# Patient Record
Sex: Female | Born: 1985
Health system: Southern US, Community
[De-identification: ages and names within clinical notes are randomized; demographics above are authoritative.]

## PROBLEM LIST (undated history)

## (undated) DIAGNOSIS — N943 Premenstrual tension syndrome: Secondary | ICD-10-CM

## (undated) DIAGNOSIS — F988 Other specified behavioral and emotional disorders with onset usually occurring in childhood and adolescence: Secondary | ICD-10-CM

## (undated) DIAGNOSIS — G932 Benign intracranial hypertension: Secondary | ICD-10-CM

## (undated) DIAGNOSIS — F329 Major depressive disorder, single episode, unspecified: Secondary | ICD-10-CM

## (undated) HISTORY — DX: Benign intracranial hypertension: G93.2

## (undated) HISTORY — DX: Other specified behavioral and emotional disorders with onset usually occurring in childhood and adolescence: F98.8

## (undated) HISTORY — PX: INNER EAR SURGERY: SHX679

## (undated) HISTORY — PX: TONSILLECTOMY: SUR1361

## (undated) HISTORY — PX: ADENOIDECTOMY: SUR15

## (undated) HISTORY — DX: Premenstrual tension syndrome: N94.3

## (undated) HISTORY — DX: Major depressive disorder, single episode, unspecified: F32.9

---

## 2008-07-27 ENCOUNTER — Emergency Department: Payer: Self-pay | Admitting: Emergency Medicine

## 2010-11-19 ENCOUNTER — Other Ambulatory Visit: Payer: Self-pay | Admitting: Neurology

## 2010-11-19 DIAGNOSIS — F32A Depression, unspecified: Secondary | ICD-10-CM

## 2010-11-19 DIAGNOSIS — F909 Attention-deficit hyperactivity disorder, unspecified type: Secondary | ICD-10-CM

## 2010-11-19 DIAGNOSIS — R519 Headache, unspecified: Secondary | ICD-10-CM

## 2010-11-19 DIAGNOSIS — F329 Major depressive disorder, single episode, unspecified: Secondary | ICD-10-CM

## 2010-11-25 ENCOUNTER — Ambulatory Visit
Admission: RE | Admit: 2010-11-25 | Discharge: 2010-11-25 | Disposition: A | Discharge status: Home / Self Care | Payer: No Typology Code available for payment source | Source: Ambulatory Visit | Attending: Neurology | Admitting: Neurology

## 2010-11-25 DIAGNOSIS — F329 Major depressive disorder, single episode, unspecified: Secondary | ICD-10-CM

## 2010-11-25 DIAGNOSIS — F909 Attention-deficit hyperactivity disorder, unspecified type: Secondary | ICD-10-CM

## 2010-11-25 DIAGNOSIS — R519 Headache, unspecified: Secondary | ICD-10-CM

## 2010-11-25 DIAGNOSIS — F32A Depression, unspecified: Secondary | ICD-10-CM

## 2011-12-03 ENCOUNTER — Encounter (INDEPENDENT_AMBULATORY_CARE_PROVIDER_SITE_OTHER): Payer: No Typology Code available for payment source | Admitting: Ophthalmology

## 2011-12-03 DIAGNOSIS — H43819 Vitreous degeneration, unspecified eye: Secondary | ICD-10-CM

## 2011-12-03 DIAGNOSIS — G932 Benign intracranial hypertension: Secondary | ICD-10-CM

## 2012-12-01 ENCOUNTER — Encounter: Payer: Self-pay | Admitting: Maternal and Fetal Medicine

## 2013-06-06 ENCOUNTER — Observation Stay: Payer: Self-pay | Admitting: Obstetrics and Gynecology

## 2013-06-06 LAB — PIH PROFILE
Anion Gap: 6 — ABNORMAL LOW (ref 7–16)
Creatinine: 0.98 mg/dL (ref 0.60–1.30)
EGFR (African American): >60
EGFR (Non-African Amer.): >60
MCV: 85 fL (ref 80–100)
RBC: 3.62 10*6/uL — ABNORMAL LOW (ref 3.80–5.20)
RDW: 13.7 % (ref 11.5–14.5)

## 2013-06-06 LAB — PROTEIN / CREATININE RATIO, URINE: Protein, Random Urine: 60 mg/dL — ABNORMAL HIGH (ref 0–12)

## 2013-06-08 ENCOUNTER — Encounter: Payer: Self-pay | Admitting: Obstetrics and Gynecology

## 2013-06-08 ENCOUNTER — Observation Stay: Payer: Self-pay

## 2013-06-08 LAB — PIH PROFILE
Anion Gap: 4 — ABNORMAL LOW (ref 7–16)
BUN: 8 mg/dL (ref 7–18)
Chloride: 106 mmol/L (ref 98–107)
EGFR (African American): >60
Glucose: 71 mg/dL (ref 65–99)
HCT: 33.1 % — ABNORMAL LOW (ref 35.0–47.0)
HGB: 11.1 g/dL — ABNORMAL LOW (ref 12.0–16.0)
MCHC: 33.5 g/dL (ref 32.0–36.0)
Osmolality: 267 (ref 275–301)
Platelet: 259 10*3/uL (ref 150–440)
Potassium: 3.9 mmol/L (ref 3.5–5.1)
Sodium: 135 mmol/L — ABNORMAL LOW (ref 136–145)
WBC: 11.7 10*3/uL — ABNORMAL HIGH (ref 3.6–11.0)

## 2013-06-09 ENCOUNTER — Inpatient Hospital Stay: Payer: Self-pay

## 2013-06-09 LAB — URINALYSIS, COMPLETE
Ketone: NEGATIVE
Leukocyte Esterase: NEGATIVE
Nitrite: NEGATIVE
Ph: 6 (ref 4.5–8.0)
RBC,UR: 1 /HPF (ref 0–5)
Squamous Epithelial: 18

## 2013-06-09 LAB — PIH PROFILE
Anion Gap: 8 (ref 7–16)
Calcium, Total: 8.7 mg/dL (ref 8.5–10.1)
Chloride: 105 mmol/L (ref 98–107)
Creatinine: 0.87 mg/dL (ref 0.60–1.30)
EGFR (African American): >60
EGFR (Non-African Amer.): >60
Platelet: 257 10*3/uL (ref 150–440)
RBC: 3.74 10*6/uL — ABNORMAL LOW (ref 3.80–5.20)
RDW: 14 % (ref 11.5–14.5)
SGOT(AST): 12 U/L — ABNORMAL LOW (ref 15–37)
Sodium: 135 mmol/L — ABNORMAL LOW (ref 136–145)
WBC: 12.4 10*3/uL — ABNORMAL HIGH (ref 3.6–11.0)

## 2013-06-09 LAB — PROTEIN / CREATININE RATIO, URINE: Protein, Random Urine: 32 mg/dL — ABNORMAL HIGH (ref 0–12)

## 2013-06-12 LAB — HEMATOCRIT: HCT: 26.7 % — ABNORMAL LOW (ref 35.0–47.0)

## 2014-04-04 ENCOUNTER — Ambulatory Visit: Payer: Self-pay | Admitting: Family Medicine

## 2014-11-30 NOTE — Consult Note (Signed)
Referral Information:  Reason for Ref[facility]1P0 [facility] 37+ [facility]  h/o Pseudotumor Cerebri, Newly recognized right unilateral pyelectasis in third trimester Elevated blood pressure in clinic today   Referring Physician [facility]   Prenatal Hx Newly recognized pyelectasis in fetus  Patient has a h/o  pseudotumor cerebri off meds doign well normal third tirmester eye exam  Recent evaluation for elevated BP on L&D , pt notes 12 pound weight gain in 2 weeks and has exchanged rings and is wearing flipflops, she vomited this am  on zoloft   Past Obstetrical Hx primigravida   Home Medications: Medication Instructions Status  Zoloft 50 mg oral tablet 1 tab(s) orally once a day Active  Prenatal 1 Prenatal Multivitamins with Folic Acid 0.975 mg oral capsule 1 cap(s) orally once a day Active   Allergies:   Amoxicillin: Hives  Zithromax: Hives  Vital Signs/Notes:  Nursing Vital Signs:  **Vital Signs.:   30-Oct-14 09:26  Vital Signs Type Routine; Perinatal Clinic  Temperature Temperature (F) 97.5  Celsius 36.3  Pulse Pulse 82  Respirations Respirations 20  Systolic BP Systolic BP 157  Diastolic BP (mmHg) Diastolic BP (mmHg) 95  Mean BP 115    09:36  Vital Signs Type Recheck  Systolic BP Systolic BP 164  Diastolic BP (mmHg) Diastolic BP (mmHg) 95  Mean BP 118    10:37  Systolic BP Systolic BP 168  Diastolic BP (mmHg) Diastolic BP (mmHg) 84  Mean BP 112   Perinatal Consult:  Past Medical History cont'd -Pseudotumor The patient reports that she had the diagnosis made 2 years ago. She was placed non acetazolamide. Since her diagnosis, the patient reports losing 30 pounds. She no longer has symptoms. For the past year, she has not been using the acetazolamide. - on zoloft  - newly elevated BP   Soc Hx married   Impression/Recommendations:  Impression IUP at 337 +w with EDC of 06/24/2013  1) History of pseudotumor cerebri off meds  2) New recognition of  left fetal pyelectasis - SFU 1- 7.284mm pelvis no caliectasis  3) elevated BP, edema  recent preeclampsia w/u , emesis this am 4) on Zoloft   Recommendations 1-[facility] BP a t 37 + weeks gestation 2-Most pediatric practitioners recommend postnatal imaging of kidney (either u/s or VCUG- voiding cysto-urethrogram  ) to assess for persistent  pyelectasis and reflux that might place the infant at risk for urinary infection. If persistent, many will offer antibiotic prophylaxis and follow up to assess for resolution of dialtion. Dr Marisue HumbleJohn Weiner and Lorayne Marekynthia Camille NP at [facility] [phone] can offer support /care as needed for the infant . 3 continue zoloft   Plan:  Delivery Mode Vaginal    Total Time Spent with Patient 15 minutes   >50% of visit spent in couseling/coordination of care yes   Office Use Only 99213  Office Visit Level 3 (15min) EST exp prob focused outpt   Coding Description: FETAL - 2nd/3rd TRIMESTER INDICATION(S).   Pylectasis.   MATERNAL CONDITIONS/HISTORY INDICATION(S).   Pre-eclampsia, mild.   OTHER: Pseudotumor Cerebri.  Electronic Signatures: Rondall AllegraLivingston, Juanangel Soderholm Gresham (MD)  (Signed 30-Oct-14 11:06)  Authored: Referral, Home Medications, Allergies, Vital Signs/Notes, Consult, Impression, Plan, Billing, Coding Description   Last Updated: 30-Oct-14 11:06 by Rondall AllegraLivingston, Anyjah Roundtree Gresham (MD)

## 2014-11-30 NOTE — Op Note (Signed)
PATIENT NAME:  Kendra Freeman, Kendra Freeman MR#:  [id number] DATE OF BIRTH:  11-23-1985  DATE OF PROCEDURE:  06/11/2013  PREOPERATIVE DIAGNOSIS: Failure to progress.   POSTOPERATIVE DIAGNOSIS: Failure to progress.   PROCEDURE: Primary low transverse c section.   SURGEON: Dr. Flint MelterLashawn A. Patton SallesWeaver- Lee   ASSISTANT: Kandice HamsAmanda Ore , scrub tech.   ANESTHESIA: Spinal.   ESTIMATED BLOOD LOSS: 400 mL.  OPERATIVE FLUIDS: 1200 mL.   COMPLICATIONS: None.   FINDINGS: Vertex female infant, 3190 grams, Apgars 9 and 9, normal uterus, tubes, and ovaries.   INDICATIONS: The patient is a 29 year old G1, P0 who presents for evaluation for gestational hypertension. She was induced secondary to that. The patient did not progress past 5 cm after many hours of adequate contractions. Decision was made to proceed to deliver via C-section. The risks, benefits, indications and alternatives of the procedure were explained and informed consent was obtained.   PROCEDURE: The patient was taken to the operating room with IV fluids running. She was prepped and draped in the usual sterile fashion in leftward tilt. A Pfannenstiel skin incision was made and carried down to underlying fascia with the knife. The fascia was nicked in the midline. The incision was extended laterally. The superior aspect of the fascia was grasped with Coker clamps and the underlying rectus muscles were dissected off. This was repeated on the inferior fascia. Rectus muscles were divided in the midline. The peritoneum was entered bluntly. The opening was extended. The bladder blade was placed. The vesicouterine peritoneum was grasped and entered sharply with the Metzenbaum. The bladder flap was created digitally. The hysterotomy incision was made and carried to underlying fetal parts. The opening was extended. The infant's head was grasped and delivered atraumatically through the hysterotomy incision. The mouth and nose were bulb suctioned. The cord was clamped x 2 and cut  and the infant was handed to the waiting nursery staff. The placenta was expressed. The uterus was exteriorized and cleared of all clot and debris. The hysterotomy incision was closed with #0 Monocryl in a running locked fashion. The surgical site was examined and found to be hemostatic. The uterus was returned to the abdomen. The abdomen and gutters were irrigated with copious amounts of warm normal saline. The peritoneum was closed with a 2-0 Vicryl. The On-Q pump apparatus was placed according to the manufacturer's instructions. The fascia was closed with #1 PDS. The skin was closed with a 4-0 Vicryl. The catheters were each bolused with 5 mL of 5% bupivacaine. The OnQ  catheters exited and the patient's abdomen were sealed using Dermabond skin glue. The catheters were secured to the patient's abdomen using Steri-Strips and Tegaderm. The patient tolerated the procedure well. Sponge, needle and instrument counts were correct x 2. The patient was taken to the recovery room.    ____________________________ Sonda PrimesLashawn A. Patton SallesWeaver-Lee, MD law:sg D: 06/11/2013 06:59:22 ET T: 06/11/2013 08:16:32 ET JOB#: [id number]  cc: Flint MelterLashawn A. Patton SallesWeaver-Lee, MD, <Dictator> Janyth ContesLASHAWN A WEAVER LEE MD ELECTRONICALLY SIGNED 06/13/2013 13:37

## 2014-11-30 NOTE — Consult Note (Signed)
Referral Information:  Reason for Referral Pseudotumor Cerebri   Referring Physician [facility]   Prenatal Hx Patient sent for consult secondary to her diagnosis of pseudotumor cerebri. The patient reports that she had the diagnosis made 2 years ago. She was placed non acetazolamide. Since her diagnosis, the patient reports losing 30 pounds. She no longer has symptoms. For the past year, she has not been using the acetazolamide.   Home Medications: Medication Instructions Status  Zoloft 50 mg oral tablet 1 tab(s) orally once a day Active  Prenatal 1 Prenatal Multivitamins with Folic Acid 0.975 mg oral capsule 1 cap(s) orally once a day Active   Allergies:   Amoxicillin: Hives  Zithromax: Hives  Vital Signs/Notes:  Nursing Vital Signs: **Vital Signs.:   24-Apr-14 09:47  Vital Signs Type Admission  Temperature Temperature (F) 98.9  Celsius 37.1  Temperature Source oral  Pulse Pulse 76  Respirations Respirations 18  Systolic BP Systolic BP 129  Diastolic BP (mmHg) Diastolic BP (mmHg) 72  Mean BP 91   Impression/Recommendations:  Impression 1) History of pseudotumor cerebri 2) Currently without symptoms and not on medications 3) Acetazolamide may be used in pregnancy if needed   Recommendations 1) Routine prenatal care 2) No special considerations needed for labor management   a) Cesarean delivery not indicated for this condition   b) Shortening or passive second stage not indicated for this condition 3) If symptoms return, refer to neurologist 4) If symptoms return, still no special considerations needed for labor management    Total Time Spent with Patient 15 minutes   >50% of visit spent in couseling/coordination of care yes   Office Use Only 99241  Level 1 (15min) NEW office consult prob focused   Coding Description: OTHER: Pseudotumor Cerebri.  Electronic Signatures: Marcelino ScotBrancazio, Shaily Librizzi (MD)  (Signed 24-Apr-14 11:12)  Authored: Referral, Home Medications,  Allergies, Vital Signs/Notes, Impression, Billing, Coding Description   Last Updated: 24-Apr-14 11:12 by Marcelino ScotBrancazio, Vonzella Althaus (MD)

## 2014-12-18 NOTE — H&P (Signed)
L&D Evaluation:  History Expanded:  HPI 29 yo G1P0 with EDC=06/24/13 by LMP=09/17/2012 and now 3213w6d for Induction of labor for elevated BP 150s systolic for a PIH. She was seen 3 days ago for elevated pressures in the office and +3 protein on a dipstick. Chemistries at that time remarkable for a mildly elevated creatinine and BUN and a protein/cr=.  She denies HA, RUQ pain, and N/V, visual changes. Was seen in DP for fetal pelviectasis on left of 7.364mm. AFI=13.7 cm today and EFW=7#4oz. [facility] Had an 8wk2d ultrasound c/w dates. Pelviectasis not seen until a 34 week ultrasound which also revealed normal growth. She is A+/VI/RI/tdap was given 9/9, flu 9/23 GBS neg, Risk factors include nullipara, first trimester testing is neg.   Presents with inc BP   Patient's Medical History obesity, pseudotumor cerebri, depression   Patient's Surgical History T&A, ear surgery   Medications Pre Natal Vitamins  Zoloft 50 mgm daily   Allergies Amoxicillin (hives) and Azithromycin (hives)   Social History none   Family History Non-Contributory   ROS:  ROS All systems were reviewed.  HEENT, CNS, GI, GU, Respiratory, CV, Renal and Musculoskeletal systems were found to be normal.   Exam:  Vital Signs 157/89   General no apparent distress   Mental Status clear   Chest clear   Heart normal sinus rhythm, no murmur/gallop/rubs   Abdomen gravid, non-tender   Estimated Fetal Weight Average for gestational age   Fetal Position vtx   Back no CVAT   Edema 2+   Reflexes 1+   Pelvic no external lesions, closed/50%/-2   Mebranes Intact   FHT normal rate with no decels   FHT Description mod variability   Ucx irregular, mild   Skin dry   Other BUN/CR=8/.87, SGOT WNL, Uric acid=4.4, plt=259K. Hmg=11.1gm/dl.   Impression:  Impression IUP at 37 5/7 weeks with gestational hypertension. Reactive NST, negative CST.   Plan:  Plan EFM/NST, IOL tonight  starting with Cervidil ripening.   Explained to patient risks of induction vs the risks of continued close observation for worsening hypertension or S&S of preeclampsia.   Comments Patient advised of the risks of induction which include hyperstimulation, fetal intolerance, failed induction, and the risks of Csection. She does wish to proceed with Induction of labor.  Pt has been fully informed of the pros and cons, risk/benefits continued close observation versus the risks of induction. She understands that there are uncommon risks to induction, which include but are not limited to:  frequent and/or prolonged uterine contractions, fetal distress, uterine rupture and lack of success of induction.  She also has been informed that if the induction is not successful a Cesarean Section may be necessary.  All questions have been answered and she is in agreement with induction.   Electronic Signatures: Letitia LibraHarris, Posey Petrik Paul (MD)  (Signed 31-Oct-14 21:10)  Authored: L&D Evaluation   Last Updated: 31-Oct-14 21:10 by Letitia LibraHarris, Domique Clapper Paul (MD)

## 2014-12-18 NOTE — H&P (Signed)
L&D Evaluation:  History:  HPI 29 yo G1P0 with EDC=06/24/13 by LMP=09/17/2012 and now 5971w5d [facility] 150s systolic for a PIH evaluation. She was seen 2 days ago for elevated pressuresin  the office and +3 protein on a dipstick. Chemistries at that time remarkable for a mildly elevated creatinine and BUN and a protein/cr=.  She denies HA, RUQ pain, and N/V, visual changes. Was seen in DP for fetal pelviectasis on left of 7.534mm. AFI=13.7 cm today and EFW=7#4oz. [facility] Had an 8wk2d ultrasound c/w dates. Pelviectasis not seen until a 34 week ultrasound which also revealed normal growth. She is A+/VI/RI/tdap was given 9/9, flu 9/23 GBS neg, Risk factors include nullipara, first trimester testing is neg.   Presents with inc BP   Patient's Medical History obesity, pseudotumor cerebri, depression   Patient's Surgical History T&A, ear surgery   Medications Pre Natal Vitamins  Zoloft 50 mgm daily   Allergies Amoxicillin (hives) and Azithromycin (hives)   Social History none   Family History Non-Contributory   ROS:  ROS All systems were reviewed.  HEENT, CNS, GI, GU, Respiratory, CV, Renal and Musculoskeletal systems were found to be normal.   Exam:  Vital Signs 157/88, 140/90, 137/81, 127/74, 128/61, 154/93, 127/69   Urine Protein trace, P/C=149mg m   General no apparent distress   Mental Status clear   Chest clear   Heart normal sinus rhythm, no murmur/gallop/rubs   Abdomen gravid, non-tender   Estimated Fetal Weight Average for gestational age   Fetal Position vtx   Edema 2+   Reflexes 1+   Pelvic no external lesions, closed/50%/-2   Mebranes Intact   FHT normal rate with no decels, 125-130 with accels to 150s to 170s   FHT Description mod variability   Ucx irregular, mild   Skin dry   Other BUN/CR=8/.87, SGOT WNL, Uric acid=4.4, plt=259K. Hmg=11.1gm/dl.   Impression:  Impression IUP at 37 5/7 weeks with gestational hypertension.  Reactive NST, negative CST.   Plan:  Plan Consulted Dr Jean RosenthalJackson and Dr Bonney AidStaebler. Plan on  IOL tonight  starting with Cervidil ripening.  Explained to patient risks of induction vs the risks of continued close observation for worsening hypertension or S&S of preeclampsia.   Comments Patient advised of the risks of induction which include hyperstimulation, fetal intolerance, failed induction, and the risks of Csection. She doeas wish to proceed with IOL. Because all labor beds are filled, will have patient call tonight to see if bed available. If no available bed, will admit tomorrow for IOL.   Electronic Signatures: Trinna BalloonGutierrez, Chardai Gangemi L (CNM)  (Signed 31-Oct-14 01:05)  Authored: L&D Evaluation   Last Updated: 31-Oct-14 01:05 by Trinna BalloonGutierrez, Kenyetta Fife L (CNM)

## 2014-12-18 NOTE — H&P (Signed)
L&D Evaluation:  History Expanded:  HPI 29 yo G1P0 at 10960w3d sent from office for elevated BP 150s systolic and 3+ protein on dip, who has hd normal BP all pregnancy, she denies HA, RUQ pain, and NV/ SHE is A+/VI/RI/tdap was given 9/9, flu 9/23 GBS neg, Risk factors include nullipara, first rimester testing is neg,   Gravida 1   Term 0   PreTerm 0   Abortion 0   Living 0   Blood Type (Maternal) A positive   Group B Strep Results Maternal (Result >5wks must be treated as unknown) negative   Maternal HIV Negative   Maternal Syphilis Ab Nonreactive   Maternal Varicella Immune   Rubella Results (Maternal) immune   Maternal T-Dap Immune   EDC 24-Jun-2013   Presents with inc BP   Patient's Medical History No Chronic Illness  obesity   Patient's Surgical History none   Medications Pre Natal Vitamins   Allergies NKDA   Social History none   Family History Non-Contributory   ROS:  ROS All systems were reviewed.  HEENT, CNS, GI, GU, Respiratory, CV, Renal and Musculoskeletal systems were found to be normal.   Exam:  Vital Signs stable  130s systolic   Urine Protein 1+   General no apparent distress   Mental Status clear   Chest clear   Heart normal sinus rhythm   Abdomen gravid, non-tender   Estimated Fetal Weight Average for gestational age   Fetal Position v   Back no CVAT   Edema no edema   Reflexes 1+   Clonus positive   Pelvic no external lesions, closed   Mebranes Intact   FHT normal rate with no decels   FHT Description strictly reactive no decels Cat 1,   Fetal Heart Rate 140   Ucx irregular   Skin dry   Lymph no lymphadenopathy   Impression:  Impression evaluation for PIH   Plan:  Plan EFM/NST, monitor contractions and for cervical change, PIH panel   Comments will check labs and do PIH eval.   Follow Up Appointment need to schedule. 3 days   Electronic Signatures: Adria DevonKlett, Kendra Freeman (MD)  (Signed 28-Oct-14  16:34)  Authored: L&D Evaluation   Last Updated: 28-Oct-14 16:34 by Adria DevonKlett, Kendra Freeman (MD)

## 2015-09-12 IMAGING — US US OB DETAIL+14 WK - NRPT [facility]
1 series · 14 of 28 positions shown · non-contrast
Comparison: none

[Series 1: us ob detail+14 wk - nrpt [facility] · 0.30mm/px · 14 of 79 slices shown]
[im 3/79]
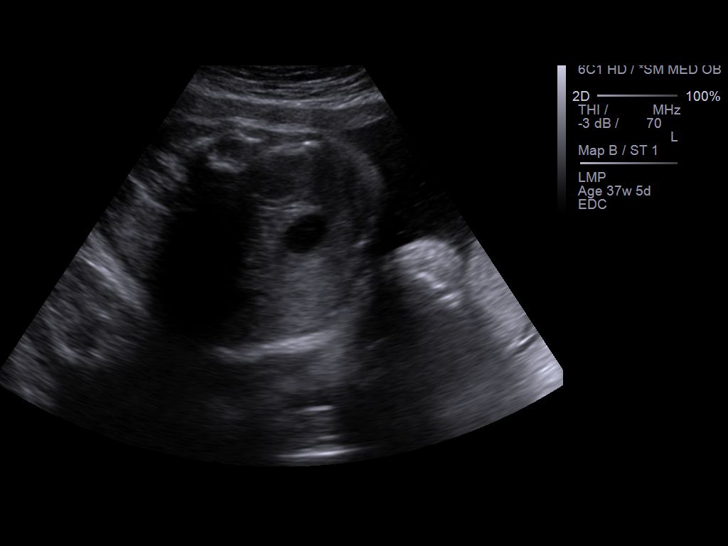
[im 9/79]
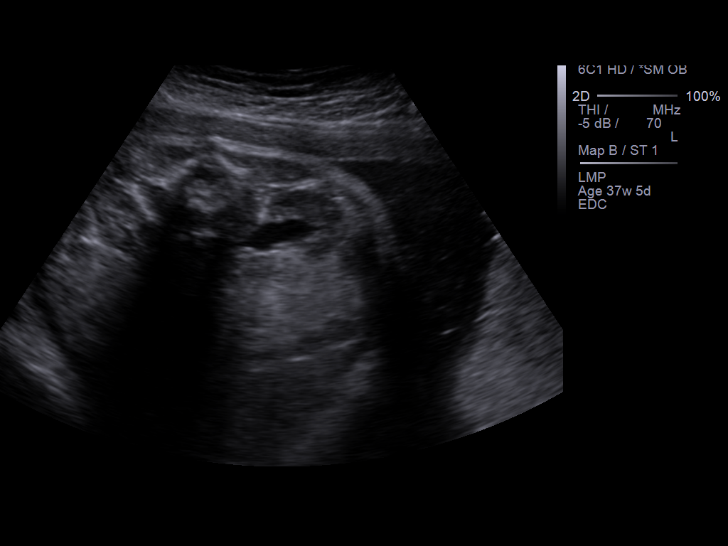
[im 15/79]
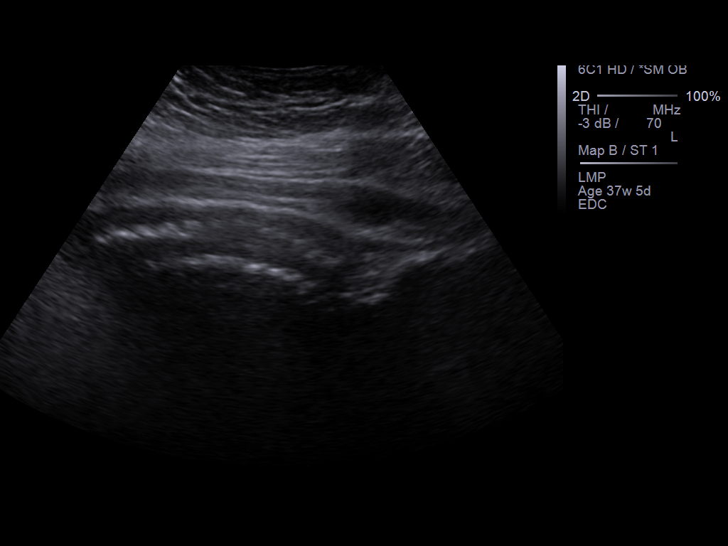
[im 21/79]
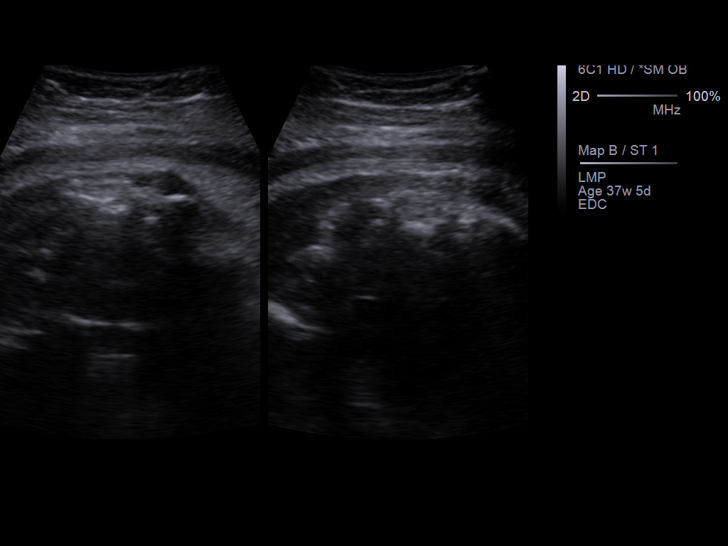
[im 27/79]
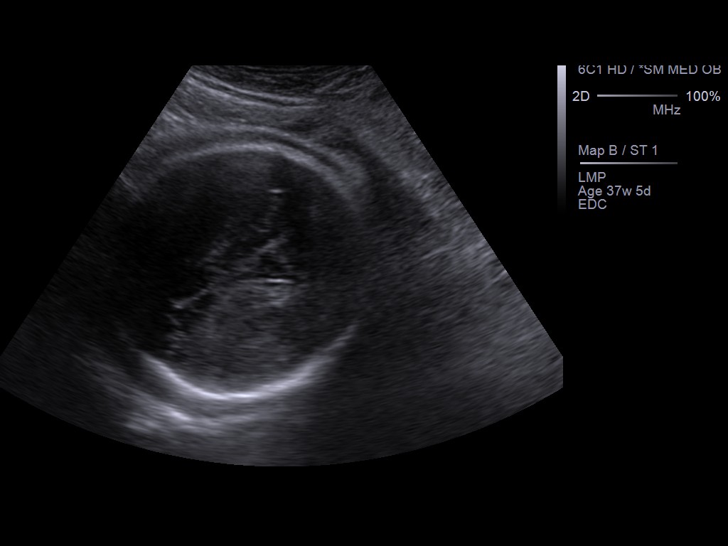
[im 32/79]
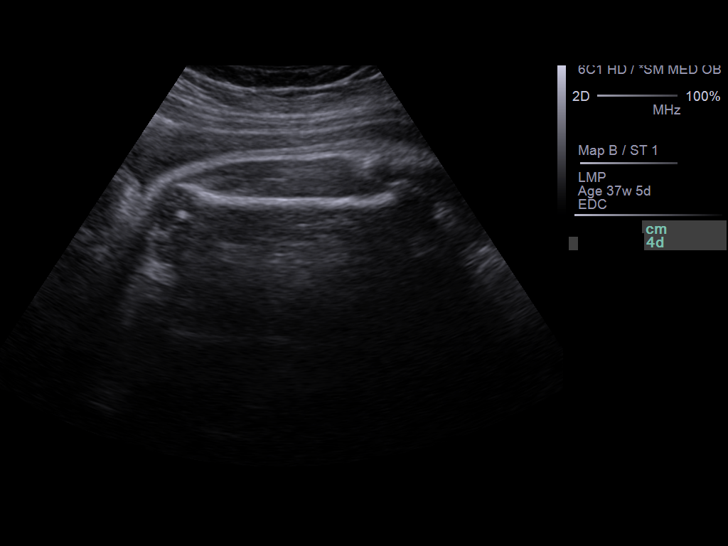
[im 38/79]
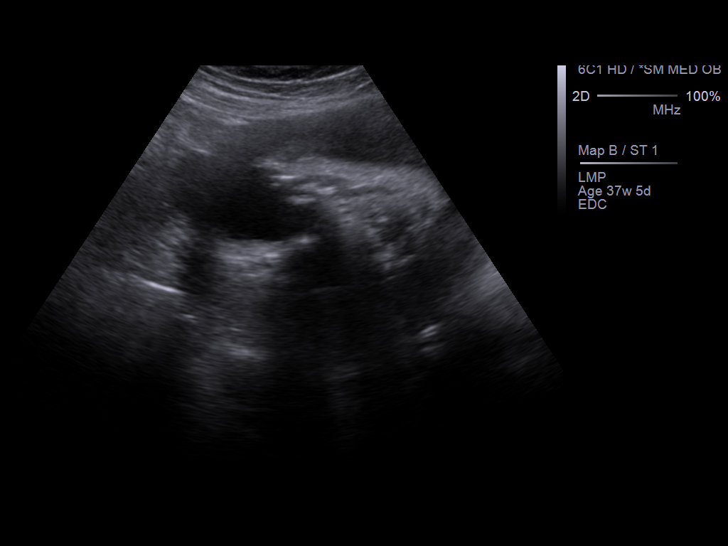
[im 44/79]
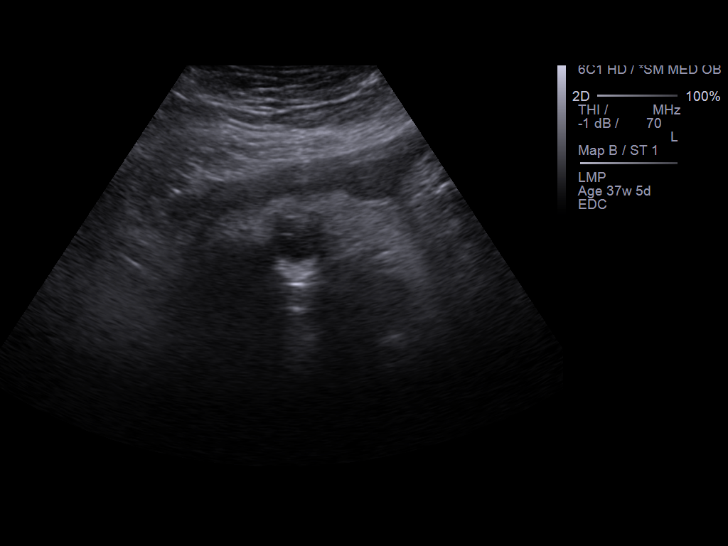
[im 50/79]
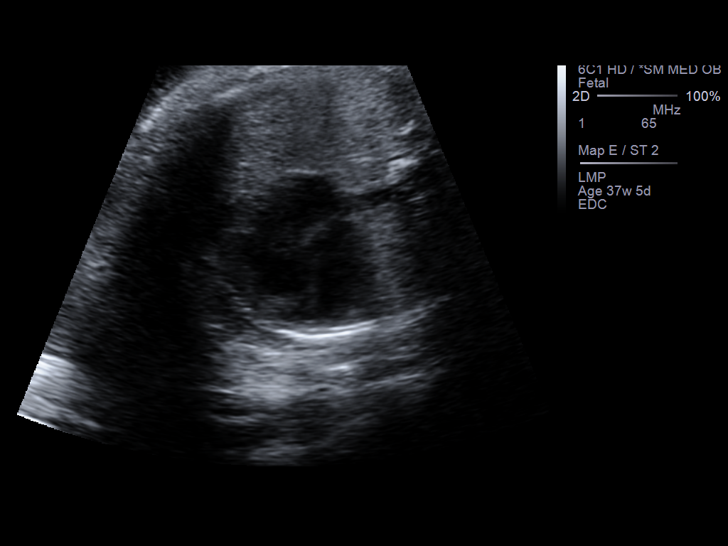
[im 55/79]
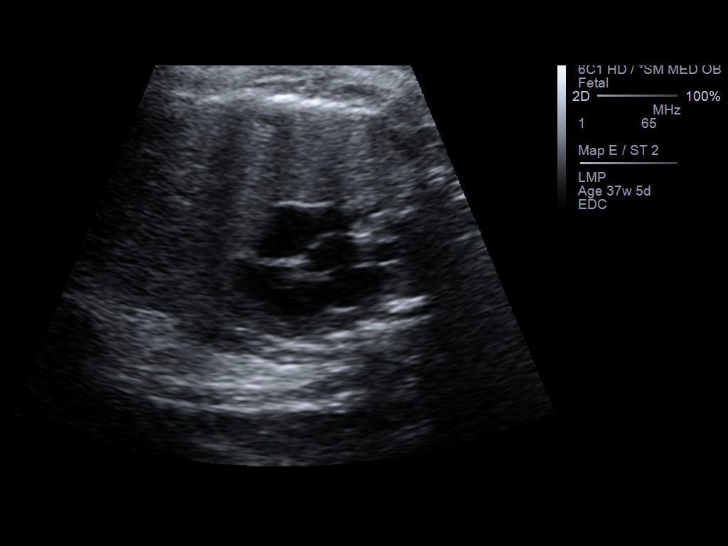
[im 61/79]
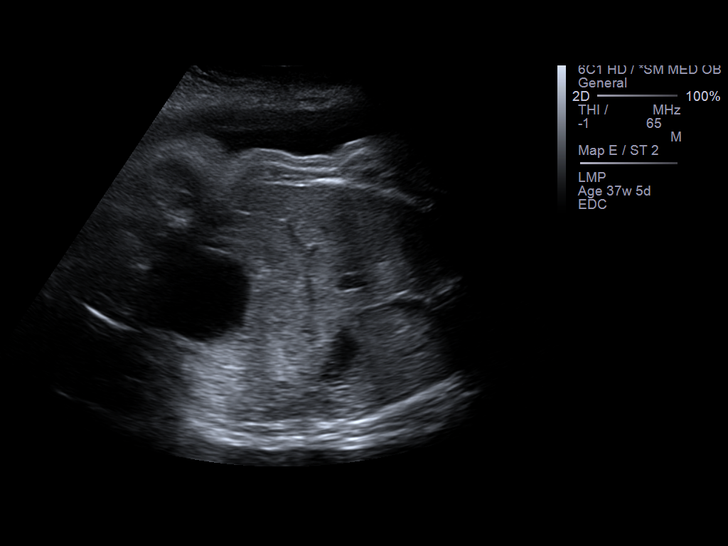
[im 67/79]
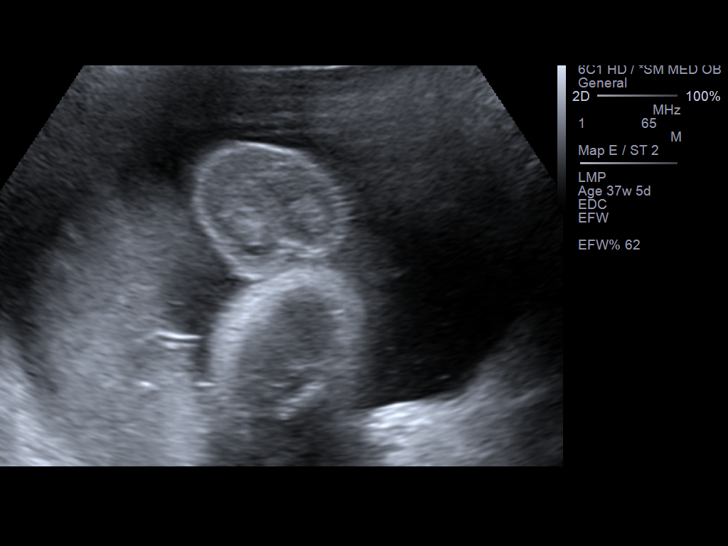
[im 73/79]
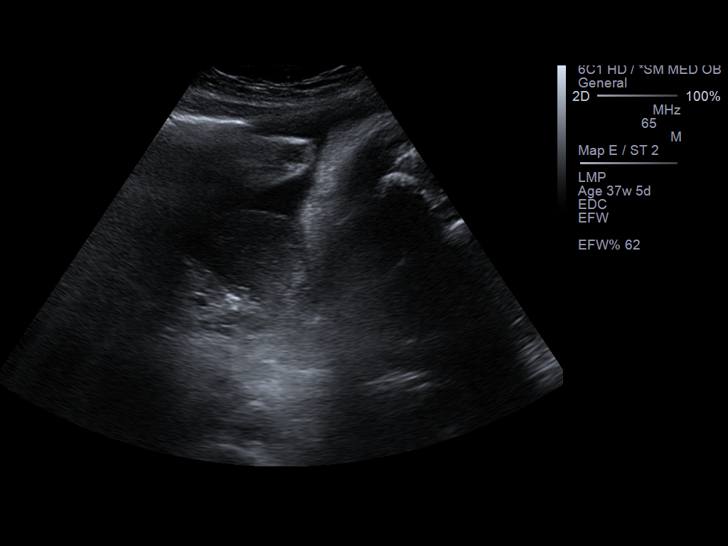
[im 79/79]
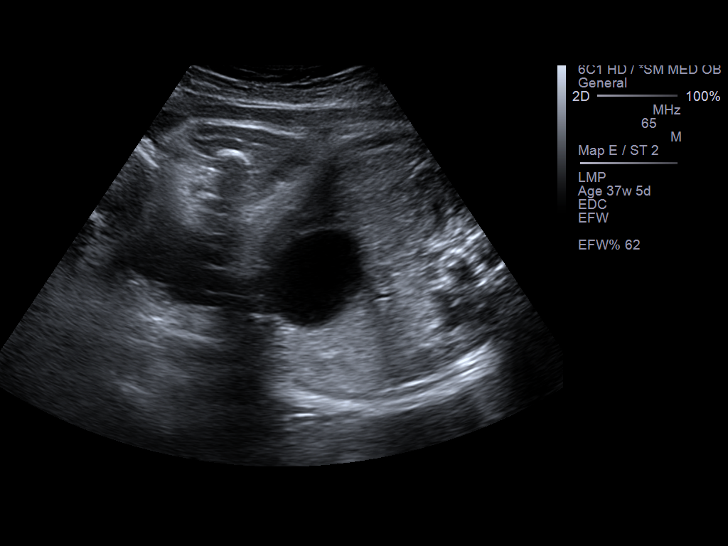

[14 of 28 positions shown; findings below may reference images not displayed]

IMAGES IMPORTED FROM THE SYNGO WORKFLOW SYSTEM
NO DICTATION FOR STUDY

## 2016-06-02 DIAGNOSIS — Z23 Encounter for immunization: Secondary | ICD-10-CM | POA: Diagnosis not present

## 2016-06-08 DIAGNOSIS — Z0100 Encounter for examination of eyes and vision without abnormal findings: Secondary | ICD-10-CM | POA: Diagnosis not present

## 2016-08-10 DIAGNOSIS — R7989 Other specified abnormal findings of blood chemistry: Secondary | ICD-10-CM

## 2016-08-10 HISTORY — DX: Other specified abnormal findings of blood chemistry: R79.89

## 2016-10-14 ENCOUNTER — Telehealth: Payer: Self-pay | Admitting: Obstetrics & Gynecology

## 2016-10-14 NOTE — Telephone Encounter (Signed)
[facility] [place]

## 2016-10-14 NOTE — Telephone Encounter (Signed)
PT is calling to schedule her annual appointment and needs an refill for zoloft 50 mg. WU#[phone]#[phone] . [facility] [place]

## 2016-10-21 ENCOUNTER — Other Ambulatory Visit: Payer: Self-pay | Admitting: Obstetrics & Gynecology

## 2016-10-22 NOTE — Telephone Encounter (Signed)
Left message for pt to schedule annual.

## 2016-10-22 NOTE — Telephone Encounter (Signed)
Appt annual soon plz; refill one month

## 2016-10-22 NOTE — Telephone Encounter (Signed)
Pt calling to follow up on medication refill. Pt notify to call her Pharmacy.

## 2016-11-11 ENCOUNTER — Encounter: Payer: Self-pay | Admitting: Obstetrics & Gynecology

## 2016-11-17 ENCOUNTER — Ambulatory Visit: Payer: Self-pay | Admitting: Obstetrics & Gynecology

## 2016-12-07 ENCOUNTER — Encounter: Payer: Self-pay | Admitting: Obstetrics & Gynecology

## 2016-12-07 ENCOUNTER — Ambulatory Visit (INDEPENDENT_AMBULATORY_CARE_PROVIDER_SITE_OTHER): Payer: BLUE CROSS/BLUE SHIELD | Admitting: Obstetrics & Gynecology

## 2016-12-07 VITALS — BP 120/80 | HR 68 | Ht 64.0 in | Wt 183.0 lb

## 2016-12-07 DIAGNOSIS — F419 Anxiety disorder, unspecified: Secondary | ICD-10-CM | POA: Diagnosis not present

## 2016-12-07 DIAGNOSIS — Z13220 Encounter for screening for lipoid disorders: Secondary | ICD-10-CM

## 2016-12-07 DIAGNOSIS — Z131 Encounter for screening for diabetes mellitus: Secondary | ICD-10-CM | POA: Diagnosis not present

## 2016-12-07 DIAGNOSIS — Z1321 Encounter for screening for nutritional disorder: Secondary | ICD-10-CM | POA: Diagnosis not present

## 2016-12-07 DIAGNOSIS — Z0000 Encounter for general adult medical examination without abnormal findings: Secondary | ICD-10-CM | POA: Diagnosis not present

## 2016-12-07 DIAGNOSIS — F329 Major depressive disorder, single episode, unspecified: Secondary | ICD-10-CM

## 2016-12-07 DIAGNOSIS — F32A Depression, unspecified: Secondary | ICD-10-CM

## 2016-12-07 DIAGNOSIS — Z1329 Encounter for screening for other suspected endocrine disorder: Secondary | ICD-10-CM | POA: Diagnosis not present

## 2016-12-07 MED ORDER — SERTRALINE HCL 50 MG PO TABS: 75.0000 mg | ORAL_TABLET | Freq: Every day | ORAL | 12 refills | Status: DC

## 2016-12-07 NOTE — Addendum Note (Signed)
Addended by: Nadara Mustard on: 12/07/2016 11:54 AM   Modules accepted: Kipp Brood

## 2016-12-07 NOTE — Progress Notes (Signed)
HPI:      [name] is a 31 y.o. G1P1001 who LMP was No LMP recorded., she presents today for her annual examination. The patient has no complaints today. The patient is sexually active. Her 2017 last pap: was normal. The patient does perform self breast exams.  There is no notable family history of breast or ovarian cancer in her family.  The patient has regular exercise: yes.  The patient denies current symptoms of depression.    GYN History: Contraception: IUD  PMHx: Past Medical History:  Diagnosis Date  . Attention deficit disorder   . Pseudotumor cerebri    Past Surgical History:  Procedure Laterality Date  . ADENOIDECTOMY    . CESAREAN SECTION    . INNER EAR SURGERY    . TONSILLECTOMY     Family History  Problem Relation Age of Onset  . Hodgkin's lymphoma Maternal Grandmother   . Lymphoma Maternal Grandmother   . Hypertension Paternal Grandfather    Social History  Substance Use Topics  . Smoking status: Former Games developer  . Smokeless tobacco: Never Used  . Alcohol use No    Current Outpatient Prescriptions:  .  levonorgestrel (MIRENA) 20 MCG/24HR IUD, 1 each by Intrauterine route once., Disp: , Rfl:  .  sertraline (ZOLOFT) 50 MG tablet, Take 1.5 tablets (75 mg total) by mouth daily., Disp: 45 tablet, Rfl: 12 Allergies: Amoxicillin; Zithromax [azithromycin dihydrate]; and Azithromycin  Review of Systems  Constitutional: Negative for chills, fever and malaise/fatigue.  HENT: Negative for congestion, sinus pain and sore throat.   Eyes: Negative for blurred vision and pain.  Respiratory: Negative for cough and wheezing.   Cardiovascular: Negative for chest pain and leg swelling.  Gastrointestinal: Negative for abdominal pain, constipation, diarrhea, heartburn, nausea and vomiting.  Genitourinary: Negative for dysuria, frequency, hematuria and urgency.  Musculoskeletal: Negative for back pain, joint pain, myalgias and neck pain.  Skin: Negative for itching  and rash.  Neurological: Negative for dizziness, tremors and weakness.  Endo/Heme/Allergies: Does not bruise/bleed easily.  Psychiatric/Behavioral: Negative for depression. The patient is not nervous/anxious and does not have insomnia.     Objective: BP 120/80   Pulse 68   Ht  (1.626 m)   Wt 183 lb (83 kg)   BMI 31.41 kg/m   Filed Weights   12/07/16 1124  Weight: 183 lb (83 kg)   Body mass index is 31.41 kg/m. Physical Exam  Constitutional: She is oriented to person, place, and time. She appears well-developed and well-nourished. No distress.  Genitourinary: Rectum normal, vagina normal and uterus normal. Pelvic exam was performed with patient supine. There is no rash or lesion on the right labia. There is no rash or lesion on the left labia. Vagina exhibits no lesion. No bleeding in the vagina. Right adnexum does not display mass and does not display tenderness. Left adnexum does not display mass and does not display tenderness. Cervix does not exhibit motion tenderness, lesion, friability or polyp.   Uterus is mobile and midaxial. Uterus is not enlarged or exhibiting a mass.  HENT:  Head: Normocephalic and atraumatic. Head is without laceration.  Right Ear: Hearing normal.  Left Ear: Hearing normal.  Nose: No epistaxis.  No foreign bodies.  Mouth/Throat: Uvula is midline, oropharynx is clear and moist and mucous membranes are normal.  Eyes: Pupils are equal, round, and reactive to light.  Neck: Normal range of motion. Neck supple. No thyromegaly present.  Cardiovascular: Normal rate and regular rhythm.  Exam reveals no gallop and no friction rub.   No murmur heard. Pulmonary/Chest: Effort normal and breath sounds normal. No respiratory distress. She has no wheezes. Right breast exhibits no mass, no skin change and no tenderness. Left breast exhibits no mass, no skin change and no tenderness.  Abdominal: Soft. Bowel sounds are normal. She exhibits no distension. There is no  tenderness. There is no rebound.  Musculoskeletal: Normal range of motion.  Neurological: She is alert and oriented to person, place, and time. No cranial nerve deficit.  Skin: Skin is warm and dry.  Psychiatric: She has a normal mood and affect. Judgment normal.  Vitals reviewed.   Assessment:  ANNUAL EXAM 1. Annual physical exam   2. Anxiety and depression   3. Screening for thyroid disorder   4. Screening for diabetes mellitus   5. Screening for cholesterol level   6. Encounter for vitamin deficiency screening      Screening Plan:            1.  Cervical Screening-  Pap smear schedule reviewed with patient, due 2020  2. Breast screening- Exam annually and mammogram>40 planned   3. Colonoscopy every 10 years, Hemoccult testing - after age 56  4. Labs Ordered today  5. Counseling for contraception: IUD, year 3  Other:  1. Annual physical exam  2. Anxiety and depression - sertraline (ZOLOFT) 50 MG tablet; Take 1.5 tablets (75 mg total) by mouth daily.  Dispense: 45 tablet; Refill: 12  3. Screening for thyroid disorder - TSH; Future  4. Screening for diabetes mellitus - HgB A1c; Future  5. Screening for cholesterol level - Lipid Profile; Future  6. Encounter for vitamin deficiency screening - Vitamin D (25 hydroxy); Future      F/U  Return in about 1 year (around 12/07/2017) for Annual.  Annamarie Major, MD, [name], [facility] 12/07/2016  11:46 AM

## 2016-12-16 ENCOUNTER — Other Ambulatory Visit: Payer: BLUE CROSS/BLUE SHIELD

## 2016-12-16 DIAGNOSIS — Z1321 Encounter for screening for nutritional disorder: Secondary | ICD-10-CM

## 2016-12-16 DIAGNOSIS — Z13220 Encounter for screening for lipoid disorders: Secondary | ICD-10-CM

## 2016-12-16 DIAGNOSIS — Z131 Encounter for screening for diabetes mellitus: Secondary | ICD-10-CM

## 2016-12-16 DIAGNOSIS — Z1329 Encounter for screening for other suspected endocrine disorder: Secondary | ICD-10-CM

## 2016-12-17 ENCOUNTER — Telehealth: Payer: No Typology Code available for payment source | Admitting: Nurse Practitioner

## 2016-12-17 ENCOUNTER — Encounter: Payer: Self-pay | Admitting: Obstetrics & Gynecology

## 2016-12-17 DIAGNOSIS — J019 Acute sinusitis, unspecified: Secondary | ICD-10-CM

## 2016-12-17 LAB — LIPID PANEL
CHOL/HDL RATIO: 3.8 ratio (ref 0.0–4.4)
CHOLESTEROL TOTAL: 177 mg/dL (ref 100–199)
HDL: 46 mg/dL (ref >39)
LDL CALC: 115 mg/dL — AB (ref 0–99)
TRIGLYCERIDES: 82 mg/dL (ref 0–149)
VLDL Cholesterol Cal: 16 mg/dL (ref 5–40)

## 2016-12-17 LAB — HEMOGLOBIN A1C
Est. average glucose Bld gHb Est-mCnc: 97 mg/dL
Hgb A1c MFr Bld: 5 % (ref 4.8–5.6)

## 2016-12-17 LAB — TSH: TSH: 1.63 u[IU]/mL (ref 0.450–4.500)

## 2016-12-17 LAB — VITAMIN D 25 HYDROXY (VIT D DEFICIENCY, FRACTURES): Vit D, 25-Hydroxy: 19.5 ng/mL — ABNORMAL LOW (ref 30.0–100.0)

## 2016-12-17 MED ORDER — FLUTICASONE PROPIONATE 50 MCG/ACT NA SUSP: 2.0000 | Freq: Every day | NASAL | 0 refills | Status: DC

## 2016-12-17 MED ORDER — DOXYCYCLINE HYCLATE 100 MG PO TABS: 100.0000 mg | ORAL_TABLET | Freq: Two times a day (BID) | ORAL | 0 refills | Status: AC

## 2016-12-17 NOTE — Progress Notes (Signed)
We are sorry that you are not feeling well.  Here is how we plan to help!  Based on what you have shared with me it looks like you have sinusitis.  Sinusitis is inflammation and infection in the sinus cavities of the head.  Based on your presentation (duration and severity of symptoms), I believe you most likely have Acute Bacterial Sinusitis.This is an infection most likely caused by a bacteria. Steroid nasal sprays can help and can safely be used as often as needed for congestion, I have prescribed: Fluticasone nasal spray two sprays in each nostril twice a day.  I am also prescribing Doxycycline 100mg  twice daily for 10 days.   Some authorities believe that zinc sprays or the use of Echinacea may shorten the course of your symptoms.  Sinus infections are not as easily transmitted as other respiratory infection, however we still recommend that you avoid close contact with loved ones, especially the very young and elderly.  Remember to wash your hands thoroughly throughout the day as this is the number one way to prevent the spread of infection!  Home Care:  Only take medications as instructed by your medical team.  Complete the entire course of an antibiotic.  Do not take these medications with alcohol.  A steam or ultrasonic humidifier can help congestion.  You can place a towel over your head and breathe in the steam from hot water coming from a faucet.  Avoid close contacts especially the very young and the elderly.  Cover your mouth when you cough or sneeze.  Always remember to wash your hands.  Get Help Right Away If:  You develop worsening fever or sinus pain.  You develop a severe head ache or visual changes.  Your symptoms persist after you have completed your treatment plan.  Make sure you  Understand these instructions.  Will watch your condition.  Will get help right away if you are not doing well or get worse.  Your e-visit answers were reviewed by a board  certified advanced clinical practitioner to complete your personal care plan.  Depending on the condition, your plan could have included both over the counter or prescription medications.  If there is a problem please reply  once you have received a response from your provider.  Your safety is important to us.  If you have drug allergies check your prescription carefully.    You can use MyChart to ask questions about today's visit, request a non-urgent call back, or ask for a work or school excuse for 24 hours related to this e-Visit. If it has been greater than 24 hours you will need to follow up with your provider, or enter a new e-Visit to address those concerns.  You will get an e-mail in the next two days asking about your experience.  I hope that your e-visit has been valuable and will speed your recovery. Thank you for using e-visits.

## 2017-02-18 DIAGNOSIS — J029 Acute pharyngitis, unspecified: Secondary | ICD-10-CM | POA: Diagnosis not present

## 2017-03-18 DIAGNOSIS — J069 Acute upper respiratory infection, unspecified: Secondary | ICD-10-CM | POA: Diagnosis not present

## 2017-06-01 DIAGNOSIS — Z23 Encounter for immunization: Secondary | ICD-10-CM | POA: Diagnosis not present

## 2017-06-14 DIAGNOSIS — H5213 Myopia, bilateral: Secondary | ICD-10-CM | POA: Diagnosis not present

## 2017-12-10 ENCOUNTER — Other Ambulatory Visit: Payer: Self-pay | Admitting: Obstetrics & Gynecology

## 2017-12-10 DIAGNOSIS — F32A Depression, unspecified: Secondary | ICD-10-CM

## 2017-12-10 DIAGNOSIS — F329 Major depressive disorder, single episode, unspecified: Secondary | ICD-10-CM

## 2017-12-10 DIAGNOSIS — F419 Anxiety disorder, unspecified: Principal | ICD-10-CM

## 2017-12-10 NOTE — Telephone Encounter (Signed)
Needs Annual exam scheduled

## 2018-01-05 ENCOUNTER — Ambulatory Visit: Payer: BLUE CROSS/BLUE SHIELD | Admitting: Obstetrics & Gynecology

## 2018-01-05 ENCOUNTER — Encounter: Payer: Self-pay | Admitting: Obstetrics & Gynecology

## 2018-01-06 ENCOUNTER — Encounter: Payer: Self-pay | Admitting: Obstetrics & Gynecology

## 2018-01-06 ENCOUNTER — Ambulatory Visit (INDEPENDENT_AMBULATORY_CARE_PROVIDER_SITE_OTHER): Payer: BLUE CROSS/BLUE SHIELD | Admitting: Obstetrics & Gynecology

## 2018-01-06 ENCOUNTER — Other Ambulatory Visit: Payer: Self-pay | Admitting: Obstetrics & Gynecology

## 2018-01-06 VITALS — BP 100/70 | Ht 64.0 in | Wt 191.0 lb

## 2018-01-06 DIAGNOSIS — F418 Other specified anxiety disorders: Secondary | ICD-10-CM | POA: Insufficient documentation

## 2018-01-06 DIAGNOSIS — Z0000 Encounter for general adult medical examination without abnormal findings: Secondary | ICD-10-CM

## 2018-01-06 DIAGNOSIS — E559 Vitamin D deficiency, unspecified: Secondary | ICD-10-CM | POA: Insufficient documentation

## 2018-01-06 DIAGNOSIS — Z01419 Encounter for gynecological examination (general) (routine) without abnormal findings: Secondary | ICD-10-CM | POA: Diagnosis not present

## 2018-01-06 DIAGNOSIS — R5383 Other fatigue: Secondary | ICD-10-CM | POA: Diagnosis not present

## 2018-01-06 DIAGNOSIS — N9089 Other specified noninflammatory disorders of vulva and perineum: Secondary | ICD-10-CM | POA: Diagnosis not present

## 2018-01-06 MED ORDER — ESCITALOPRAM OXALATE 20 MG PO TABS: 20.0000 mg | ORAL_TABLET | Freq: Every day | ORAL | 11 refills | Status: DC

## 2018-01-06 NOTE — Patient Instructions (Addendum)
PAP every three years (due 2020) Labs - Vit D and CBC today    Last year - normal cholesterol and thyroid and diabetes screening IUD Exchange later this year Skin tag excision soon

## 2018-01-06 NOTE — Progress Notes (Signed)
HPI:      [name] is a 32 y.o. G1P1001 who LMP was No LMP recorded. (Menstrual status: IUD)., she presents today for her annual examination. The patient has complaints of fatigue for so than usual today. The patient is sexually active. Her last pap: approximate date 2017 and was normal. The patient does perform self breast exams.  There is no notable family history of breast or ovarian cancer in her family.  The patient has regular exercise: yes.  The patient reports current symptoms of depression and anxiety not as well controlled w Zoloft anymore  GYN History: Contraception: IUD year 4.  PMHx: Past Medical History:  Diagnosis Date  . ADD (attention deficit disorder)   . Attention deficit disorder   . Major depression   . PMS (premenstrual syndrome)   . Pseudotumor cerebri    Past Surgical History:  Procedure Laterality Date  . ADENOIDECTOMY    . CESAREAN SECTION    . INNER EAR SURGERY    . TONSILLECTOMY     Family History  Problem Relation Age of Onset  . Hodgkin's lymphoma Maternal Grandmother   . Lymphoma Maternal Grandmother   . Hypertension Paternal Grandfather   . Diabetes Father    Social History   Tobacco Use  . Smoking status: Former Games developer  . Smokeless tobacco: Never Used  Substance Use Topics  . Alcohol use: No  . Drug use: No    Current Outpatient Medications:  .  levonorgestrel (MIRENA) 20 MCG/24HR IUD, 1 each by Intrauterine route once., Disp: , Rfl:  .  escitalopram (LEXAPRO) 20 MG tablet, Take 1 tablet (20 mg total) by mouth daily., Disp: 30 tablet, Rfl: 11 .  fluticasone (FLONASE) 50 MCG/ACT nasal spray, Place 2 sprays into both nostrils daily., Disp: 16 g, Rfl: 0 Allergies: Amoxicillin; Zithromax [azithromycin dihydrate]; and Azithromycin  Review of Systems  Constitutional: Positive for malaise/fatigue. Negative for chills and fever.  HENT: Negative for congestion, sinus pain and sore throat.   Eyes: Negative for blurred vision and  pain.  Respiratory: Negative for cough and wheezing.   Cardiovascular: Negative for chest pain and leg swelling.  Gastrointestinal: Negative for abdominal pain, constipation, diarrhea, heartburn, nausea and vomiting.  Genitourinary: Negative for dysuria, frequency, hematuria and urgency.  Musculoskeletal: Negative for back pain, joint pain, myalgias and neck pain.  Skin: Negative for itching and rash.  Neurological: Negative for dizziness, tremors and weakness.  Endo/Heme/Allergies: Does not bruise/bleed easily.  Psychiatric/Behavioral: Positive for depression. The patient is nervous/anxious. The patient does not have insomnia.     Objective: BP 100/70   Ht  (1.626 m)   Wt 191 lb (86.6 kg)   BMI 32.79 kg/m   Filed Weights   01/06/18 0850  Weight: 191 lb (86.6 kg)   Body mass index is 32.79 kg/m. Physical Exam  Constitutional: She is oriented to person, place, and time. She appears well-developed and well-nourished. No distress.  Genitourinary: Rectum normal, vagina normal and uterus normal. Pelvic exam was performed with patient supine. There is no rash or lesion on the right labia. There is no rash or lesion on the left labia. Vagina exhibits no lesion. No bleeding in the vagina. Right adnexum does not display mass and does not display tenderness. Left adnexum does not display mass and does not display tenderness. Cervix does not exhibit motion tenderness, lesion, friability or polyp.   Uterus is mobile and midaxial. Uterus is not enlarged or exhibiting a mass.  HENT:  Head: Normocephalic and atraumatic. Head is without laceration.  Right Ear: Hearing normal.  Left Ear: Hearing normal.  Nose: No epistaxis.  No foreign bodies.  Mouth/Throat: Uvula is midline, oropharynx is clear and moist and mucous membranes are normal.  Eyes: Pupils are equal, round, and reactive to light.  Neck: Normal range of motion. Neck supple. No thyromegaly present.  Cardiovascular: Normal rate and  regular rhythm. Exam reveals no gallop and no friction rub.  No murmur heard. Pulmonary/Chest: Effort normal and breath sounds normal. No respiratory distress. She has no wheezes. Right breast exhibits no mass, no skin change and no tenderness. Left breast exhibits no mass, no skin change and no tenderness.  Abdominal: Soft. Bowel sounds are normal. She exhibits no distension. There is no tenderness. There is no rebound.  Musculoskeletal: Normal range of motion.  Neurological: She is alert and oriented to person, place, and time. No cranial nerve deficit.  Skin: Skin is warm and dry.  Psychiatric: She has a normal mood and affect. Judgment normal.  Vitals reviewed.   Assessment:  ANNUAL EXAM 1. Annual physical exam   2. Vitamin D deficiency   3. Fatigue, unspecified type   4. Skin tag of female perineum   5. Depression with anxiety    Screening Plan:            1.  Cervical Screening-  Pap smear schedule reviewed with patient, due 2020  2.  Labs Ordered today  5. Counseling for contraception: IUD, plan exchange later this year  Other:  1. Annual physical exam  2. Vitamin D deficiency - VITAMIN D 25 Hydroxy (Vit-D Deficiency, Fractures)  3. Fatigue, unspecified type - CBC  4. Skin tag of female perineum Excision procedure discussed and to be scheduled soon   5. Depression with anxiety Change meds to Lexapro per pt request Alternative meds and dosing discussed. Consideration for Welbutrin or Cymbalta.    F/U  Return in about 1 week (around 01/13/2018) for Procedure visit for skin tag excision (reg room).  Annamarie Major, MD, [name], [facility] 01/06/2018  9:19 AM

## 2018-01-07 LAB — CBC
HEMOGLOBIN: 13.1 g/dL (ref 11.1–15.9)
Hematocrit: 39.4 % (ref 34.0–46.6)
MCH: 29 pg (ref 26.6–33.0)
MCHC: 33.2 g/dL (ref 31.5–35.7)
MCV: 87 fL (ref 79–97)
Platelets: 252 10*3/uL (ref 150–450)
RBC: 4.52 x10E6/uL (ref 3.77–5.28)
RDW: 13.8 % (ref 12.3–15.4)
WBC: 7.1 10*3/uL (ref 3.4–10.8)

## 2018-01-07 LAB — VITAMIN D 25 HYDROXY (VIT D DEFICIENCY, FRACTURES): Vit D, 25-Hydroxy: 21.7 ng/mL — ABNORMAL LOW (ref 30.0–100.0)

## 2018-01-14 ENCOUNTER — Ambulatory Visit: Payer: BLUE CROSS/BLUE SHIELD | Admitting: Obstetrics & Gynecology

## 2018-01-20 ENCOUNTER — Ambulatory Visit (INDEPENDENT_AMBULATORY_CARE_PROVIDER_SITE_OTHER): Payer: BLUE CROSS/BLUE SHIELD | Admitting: Obstetrics & Gynecology

## 2018-01-20 ENCOUNTER — Encounter: Payer: Self-pay | Admitting: Obstetrics & Gynecology

## 2018-01-20 VITALS — BP 120/80 | Ht 64.0 in | Wt 192.0 lb

## 2018-01-20 DIAGNOSIS — N9089 Other specified noninflammatory disorders of vulva and perineum: Secondary | ICD-10-CM | POA: Insufficient documentation

## 2018-01-20 DIAGNOSIS — L918 Other hypertrophic disorders of the skin: Secondary | ICD-10-CM | POA: Insufficient documentation

## 2018-01-20 DIAGNOSIS — A630 Anogenital (venereal) warts: Secondary | ICD-10-CM | POA: Diagnosis not present

## 2018-01-20 NOTE — Progress Notes (Signed)
VULVAR BIOPSY NOTE The indications for vulvar biopsy (rule out neoplasia, establish lichen sclerosus diagnosis) were reviewed.   Risks of the biopsy including pain, bleeding, infection, inadequate specimen, scarring and need for additional procedures  were discussed. The patient stated understanding and agreed to undergo procedure today. Consent was signed,  time out performed.   The patient's vulva was prepped with Betadine. 1% lidocaine was injected into area of concern. A large 1 cm skin tag/nevus is grasped and escised at the base with a scapel.  Small bleeding was noted, a 3-0 Nylon is used to approximate the skin edges, and hemostasis was achieved using silver nitrate sticks.  The patient tolerated the procedure well. Post-procedure instructions  (pelvic rest for one week) were given to the patient. The patient is to call with heavy bleeding, fever greater than 100.4, foul smelling vaginal discharge or other concerns.   Annamarie MajorPaul Kie Calvin, MD, [facility] FrederickFACOG [facility], [facility] 01/20/2018  2:26 PM

## 2018-01-20 NOTE — Addendum Note (Signed)
Addended by: Nadara MustardHARRIS, Isadora Delorey P on: 01/20/2018 03:47 PM   Modules accepted: Orders

## 2018-01-25 LAB — PATHOLOGY

## 2018-01-27 ENCOUNTER — Ambulatory Visit (INDEPENDENT_AMBULATORY_CARE_PROVIDER_SITE_OTHER): Payer: BLUE CROSS/BLUE SHIELD | Admitting: Obstetrics & Gynecology

## 2018-01-27 ENCOUNTER — Encounter: Payer: Self-pay | Admitting: Obstetrics & Gynecology

## 2018-01-27 VITALS — BP 120/80 | Ht 64.0 in | Wt 191.0 lb

## 2018-01-27 DIAGNOSIS — A630 Anogenital (venereal) warts: Secondary | ICD-10-CM

## 2018-01-27 DIAGNOSIS — N9089 Other specified noninflammatory disorders of vulva and perineum: Secondary | ICD-10-CM

## 2018-01-27 NOTE — Progress Notes (Signed)
Obstetrics & Gynecology Office Visit  :  Chief Complaint  Patient presents with  . Results  . Follow-up   History of Present Illness: 32 y.o. G1P1001 being seen for follow up from vulvar biopsy last week and stitch removal today.  Has headed well without complaints  Past Medical History:  Past Medical History:  Diagnosis Date  . ADD (attention deficit disorder)   . Attention deficit disorder   . Major depression   . PMS (premenstrual syndrome)   . Pseudotumor cerebri     Past Surgical History:  Past Surgical History:  Procedure Laterality Date  . ADENOIDECTOMY    . CESAREAN SECTION    . INNER EAR SURGERY    . TONSILLECTOMY      Gynecologic History: No LMP recorded. (Menstrual status: IUD).  Obstetric History: G1P1001  Family History:  Family History  Problem Relation Age of Onset  . Hodgkin's lymphoma Maternal Grandmother   . Lymphoma Maternal Grandmother   . Hypertension Paternal Grandfather   . Diabetes Father     Social History:  Social History   Socioeconomic History  . Marital status: Married    Spouse name: Not on file  . Number of children: Not on file  . Years of education: Not on file  . Highest education level: Not on file  Occupational History  . Not on file  Social Needs  . Financial resource strain: Not on file  . Food insecurity:    Worry: Not on file    Inability: Not on file  . Transportation needs:    Medical: Not on file    Non-medical: Not on file  Tobacco Use  . Smoking status: Former Games developer  . Smokeless tobacco: Never Used  Substance and Sexual Activity  . Alcohol use: No  . Drug use: No  . Sexual activity: Yes    Birth control/protection: IUD  Lifestyle  . Physical activity:    Days per week: Not on file    Minutes per session: Not on file  . Stress: Not on file  Relationships  . Social connections:    Talks on phone: Not on file    Gets together: Not on file    Attends religious service: Not on file    Active  member of club or organization: Not on file    Attends meetings of clubs or organizations: Not on file    Relationship status: Not on file  . Intimate partner violence:    Fear of current or ex partner: Not on file    Emotionally abused: Not on file    Physically abused: Not on file    Forced sexual activity: Not on file  Other Topics Concern  . Not on file  Social History Narrative  . Not on file    Allergies:  Allergies  Allergen Reactions  . Amoxicillin Hives  . Zithromax [Azithromycin Dihydrate] Hives  . Azithromycin Rash   Medications: Prior to Admission medications   Medication Sig Start Date End Date Taking? Authorizing Provider  escitalopram (LEXAPRO) 20 MG tablet Take 1 tablet (20 mg total) by mouth daily. 01/06/18   Nadara Mustard, MD  fluticasone (FLONASE) 50 MCG/ACT nasal spray Place 2 sprays into both nostrils daily. 12/17/16 12/27/16  Benay Pike, NP  levonorgestrel (MIRENA) 20 MCG/24HR IUD 1 each by Intrauterine route once.    [provider]   Physical Exam Blood pressure 120/80, height 5\' 4"  (1.626 m), weight 191 lb (86.6 kg).  No LMP recorded. (Menstrual status: IUD).  General: NAD Neurologic: Grossly intact Psychiatric: mood appropriate, affect full Ext GYN- healing well at biopsy site    Stitch removed  Assessment: 32 y.o. G1P1001 presenting for follow up evaluation today  Plan: Problem List Items Addressed This Visit      Other   Vulvar lesion - Primary    Pathology- condyolma Allow to heal Stitch removed  Annamarie MajorPaul Godric Lavell, MD, [facility]inda FrederickFACOG [facility], [facility] 01/27/2018  3:34 PM

## 2018-02-28 ENCOUNTER — Encounter: Payer: Self-pay | Admitting: Obstetrics & Gynecology

## 2018-02-28 ENCOUNTER — Other Ambulatory Visit: Payer: Self-pay | Admitting: Obstetrics & Gynecology

## 2018-02-28 MED ORDER — BUPROPION HCL ER (XL) 150 MG PO TB24: 150.0000 mg | ORAL_TABLET | Freq: Every day | ORAL | 5 refills | Status: DC

## 2018-02-28 NOTE — Progress Notes (Signed)
Changing from Lexapro (and SSRIs) to Wellbutrn due to lack of full response to SSRIs

## 2018-02-28 NOTE — Telephone Encounter (Signed)
Please advise 

## 2018-03-08 ENCOUNTER — Encounter: Payer: Self-pay | Admitting: Obstetrics & Gynecology

## 2018-03-11 ENCOUNTER — Ambulatory Visit (INDEPENDENT_AMBULATORY_CARE_PROVIDER_SITE_OTHER): Payer: BLUE CROSS/BLUE SHIELD | Admitting: Family Medicine

## 2018-03-11 ENCOUNTER — Encounter: Payer: Self-pay | Admitting: Family Medicine

## 2018-03-11 VITALS — BP 118/80 | HR 71 | Temp 98.9°F | Resp 18 | Ht 64.0 in | Wt 194.1 lb

## 2018-03-11 DIAGNOSIS — F419 Anxiety disorder, unspecified: Secondary | ICD-10-CM

## 2018-03-11 DIAGNOSIS — F329 Major depressive disorder, single episode, unspecified: Secondary | ICD-10-CM

## 2018-03-11 DIAGNOSIS — F32A Depression, unspecified: Secondary | ICD-10-CM

## 2018-03-11 MED ORDER — DULOXETINE HCL 30 MG PO CPEP: 30.0000 mg | ORAL_CAPSULE | Freq: Every day | ORAL | 1 refills | Status: DC

## 2018-03-11 NOTE — Patient Instructions (Signed)
Great to meet you!  Wean off of wellbutrin -- take every other day for the next 7 days, then stop. **You did not take dose today, so I would not take today's dose.  Begin cymbalta 30mg  every day.

## 2018-03-11 NOTE — Progress Notes (Signed)
Subjective:    Patient ID: Kendra Freeman, female    DOB: 02/23/1986, 32 y.o.   MRN: [id number]  HPI Patient presents to clinic to establish care and discuss anxiety and depression. She has taken meds for depression for many years and on 2014 she switched to Zoloft when she became pregnant with her child. She used Zoloft for about 3 years with good success, but then began to feel it was not helping as much. She asked her OB/GYN to switch her to Lexapro - however the Lexapro did not have much effect. Her OB/GYN suggested she try Wellbutrin XR 150mg , she has been taking this for 2 weeks. Feels the Wellbutrin makes her "head feel swimmy" and would prefer to try a different medication. She notes feeling more stressed this time of year because her husband is a Secretary/administratorteacher and football coach - foot ball season is starting and his schedule becomes very busy, leaving patient to have to do many things at home and with her child on her own. States she has joined a Sales executivefacebook group for coaches wives and talking to these other women has been helpful.  She has done counseling off and on in the past, currently does not do counseling.   Lab work was done May 2019 by her OBGYN   Review of Systems  Constitutional: Negative for chills, diaphoresis and fever.  HENT: Negative.   Eyes: Negative.   Respiratory: Negative for cough, shortness of breath and wheezing.   Cardiovascular: Negative for chest pain, palpitations and leg swelling.  Gastrointestinal: Negative.   Endocrine: Negative for polydipsia, polyphagia and polyuria.  Genitourinary: Negative.   Musculoskeletal: Negative.   Skin: Negative for color change and pallor.  Neurological: Negative for dizziness, tremors, syncope, light-headedness and headaches.  Psychiatric/Behavioral: Positive for dysphoric mood. Negative for suicidal ideas. The patient is nervous/anxious.        Has episodes of anxiety and also feeling down/depressed. At times also feels she has  a short fuse and can be annoyed too easily.        Objective:   Physical Exam  Constitutional: She is oriented to person, place, and time. She appears well-developed and well-nourished. No distress.  HENT:  Head: Normocephalic and atraumatic.  Eyes: EOM are normal. No scleral icterus.  Cardiovascular: Normal rate, regular rhythm and normal heart sounds. Exam reveals no gallop and no friction rub.  No murmur heard. Pulmonary/Chest: Effort normal and breath sounds normal. No respiratory distress. She has no wheezes.  Neurological: She is alert and oriented to person, place, and time. No cranial nerve deficit. Coordination normal.  Gait normal.   Skin: She is not diaphoretic. No pallor.  Psychiatric: She has a normal mood and affect. Her behavior is normal. Judgment and thought content normal. She expresses no suicidal ideation.  Nursing note and vitals reviewed.     Assessment & Plan:   A total of 30  minutes were spent face-to-face with the patient during this encounter and over half of that time was spent on counseling and coordination of care. The patient was counseled on weaning off of Wellbutrin, starting Cymbalta and strategies to deal with depression and anxiety.    Depression -- Wean off of Wellbutrin and begin Cymbalta. Cymbalta started at low dose so we can increase if needed. She declines referral to counseling/psychiatry at this time.   Anxiety -- Discussed strategies to help mood and anxiety. Patient is looking forward to her child starting soccer because it will give them  an activity to do out side of the house together. Encouraged to look into different hobbies to do by herself and with her child to keep self busy.   She will follow up in 3 weeks to see how she is feeling on Cymbalta. We can also plan to repeat some lab work (vit D was lower when check in May 2019 by OB/GYN)

## 2018-04-01 ENCOUNTER — Encounter: Payer: Self-pay | Admitting: Family Medicine

## 2018-04-01 ENCOUNTER — Ambulatory Visit (INDEPENDENT_AMBULATORY_CARE_PROVIDER_SITE_OTHER): Payer: BLUE CROSS/BLUE SHIELD | Admitting: Family Medicine

## 2018-04-01 VITALS — BP 132/88 | HR 78 | Temp 98.9°F | Ht 64.0 in | Wt 192.8 lb

## 2018-04-01 DIAGNOSIS — F32A Depression, unspecified: Secondary | ICD-10-CM

## 2018-04-01 DIAGNOSIS — F329 Major depressive disorder, single episode, unspecified: Secondary | ICD-10-CM | POA: Diagnosis not present

## 2018-04-01 DIAGNOSIS — F419 Anxiety disorder, unspecified: Secondary | ICD-10-CM

## 2018-04-01 MED ORDER — HYDROXYZINE HCL 10 MG PO TABS: 10.0000 mg | ORAL_TABLET | Freq: Three times a day (TID) | ORAL | 3 refills | Status: DC | PRN

## 2018-04-01 MED ORDER — DULOXETINE HCL 30 MG PO CPEP: 30.0000 mg | ORAL_CAPSULE | Freq: Every day | ORAL | 1 refills | Status: DC

## 2018-04-01 NOTE — Progress Notes (Signed)
   Subjective:    Patient ID: Kendra MilianMary Jan Freeman, female    DOB: 07/18/1986, 32 y.o.   MRN: [id number]  HPI  Patient presents to clinic to follow-up on mood after her starting Cymbalta.  States she is feeling a lot better on this medication, and notes that even her mother has made comments that she seems a lot better.  Feels her mood is much more level, smiling more.  She is looking forward to the first football game of the season with her husband's team tonight.  Denies any suicidal ideation.  When reviewing her labs done by the GYN, I noticed her vitamin D level was low.  Patient states she has a vitamin D supplement at home but has not been taking.  Patient Active Problem List   Diagnosis Date Noted  . Depressive disorder 03/11/2018  . Anxiety 03/11/2018  . Vulvar lesion 01/20/2018  . Skin tag 01/20/2018  . Vitamin D deficiency 01/06/2018  . Skin tag of female perineum 01/06/2018  . Fatigue 01/06/2018   Social History   Tobacco Use  . Smoking status: Former Games developermoker  . Smokeless tobacco: Never Used  Substance Use Topics  . Alcohol use: No     Review of Systems   Constitutional: Negative for chills, fatigue and fever.  HENT: Negative for congestion, ear pain, sinus pain and sore throat.   Eyes: Negative.   Respiratory: Negative for cough, shortness of breath and wheezing.   Cardiovascular: Negative for chest pain, palpitations and leg swelling.  Gastrointestinal: Negative for abdominal pain, diarrhea, nausea and vomiting.  Genitourinary: Negative for dysuria, frequency and urgency.  Musculoskeletal: Negative for arthralgias and myalgias.  Skin: Negative for color change, pallor and rash.  Neurological: Negative for syncope, light-headedness and headaches.  Psychiatric/Behavioral: The patient is not nervous/anxious.  Mood is improved.      Objective:   Physical Exam  Constitutional: She is oriented to person, place, and time. She appears well-developed and well-nourished. No  distress.  HENT:  Head: Normocephalic and atraumatic.  Eyes: EOM are normal. No scleral icterus.  Cardiovascular: Normal rate and regular rhythm.  Pulmonary/Chest: Effort normal and breath sounds normal. No respiratory distress.  Neurological: She is alert and oriented to person, place, and time.  Skin: Skin is warm and dry. No pallor.  Psychiatric: She has a normal mood and affect. Her behavior is normal. Judgment and thought content normal.  Able to clearly express thoughts  Nursing note and vitals reviewed.   Vitals:   04/01/18 1129  BP: 132/88  Pulse: 78  Temp: 98.9 F (37.2 C)  SpO2: 98%   Assessment & Plan:    Depression/anxiety --    Mood is much improved on Cymbalta 30mg , she will continue this dose. Also she has been given hydroxyzine to use as needed for breakthrough anxiety.  Vitamin D deficiency - patient has vitamin D supplement 2000 units at home, advised to begin taking this dose once daily and at her next visit we will do a vitamin D level check.   Follow up 3 months for recheck, sooner if any issues arise

## 2018-04-22 ENCOUNTER — Ambulatory Visit (INDEPENDENT_AMBULATORY_CARE_PROVIDER_SITE_OTHER): Payer: BLUE CROSS/BLUE SHIELD | Admitting: Family Medicine

## 2018-04-22 ENCOUNTER — Encounter: Payer: Self-pay | Admitting: Family Medicine

## 2018-04-22 VITALS — BP 128/80 | HR 86 | Temp 98.5°F | Ht 64.0 in | Wt 193.2 lb

## 2018-04-22 DIAGNOSIS — J0140 Acute pansinusitis, unspecified: Secondary | ICD-10-CM

## 2018-04-22 MED ORDER — BENZONATATE 100 MG PO CAPS: 100.0000 mg | ORAL_CAPSULE | Freq: Two times a day (BID) | ORAL | 0 refills | Status: DC | PRN

## 2018-04-22 MED ORDER — DOXYCYCLINE HYCLATE 100 MG PO TABS: 100.0000 mg | ORAL_TABLET | Freq: Two times a day (BID) | ORAL | 0 refills | Status: DC

## 2018-04-22 NOTE — Progress Notes (Signed)
   Subjective:    Patient ID: Kendra Freeman, female    DOB: 09/09/1985, 32 y.o.   MRN: [id number]  HPI   Patient presents to clinic complaining of sinus congestion/pressure, nasal drainage or over a week.  States she developed a raspy cough over the past couple of days.  She has been using Allegra-D with minor help in controlling drainage.  States she feels very rundown and head feels very full.  Denies fever or chills.  Patient Active Problem List   Diagnosis Date Noted  . Depressive disorder 03/11/2018  . Anxiety 03/11/2018  . Vulvar lesion 01/20/2018  . Skin tag 01/20/2018  . Vitamin D deficiency 01/06/2018  . Skin tag of female perineum 01/06/2018  . Fatigue 01/06/2018   Social History   Tobacco Use  . Smoking status: Former Games developermoker  . Smokeless tobacco: Never Used  Substance Use Topics  . Alcohol use: No   Review of Systems   Constitutional: Negative for chills, fatigue and fever.  HENT: Positive for congestion, ear pain, sinus pain.   Eyes: Negative.   Respiratory: +cough. Negative for shortness of breath and wheezing.   Cardiovascular: Negative for chest pain, palpitations and leg swelling.  Gastrointestinal: Negative for abdominal pain, diarrhea, nausea and vomiting.  Genitourinary: Negative for dysuria, frequency and urgency.  Musculoskeletal: Negative for arthralgias and myalgias.  Skin: Negative for color change, pallor and rash.  Neurological: Negative for syncope, light-headedness and headaches.  Psychiatric/Behavioral: The patient is not nervous/anxious.       Objective:   Physical Exam  Constitutional: She is oriented to person, place, and time. She appears well-developed and well-nourished. No distress.  HENT:  Head: Normocephalic and atraumatic.  Fullness bilateral TMs, +postnasal drip. +maxillary and facial sinus tenderness.  Eyes: Right eye exhibits no discharge. Left eye exhibits no discharge. No scleral icterus.  Cardiovascular: Normal rate and  regular rhythm.  Pulmonary/Chest: Effort normal and breath sounds normal. No respiratory distress. She has no wheezes. She has no rales.  Musculoskeletal: She exhibits no edema.  Lymphadenopathy:    She has no cervical adenopathy.  Neurological: She is alert and oriented to person, place, and time.  Skin: Skin is warm and dry. No pallor.  Psychiatric: She has a normal mood and affect. Her behavior is normal.  Nursing note and vitals reviewed.  Vitals:   04/22/18 1416  BP: 128/80  Pulse: 86  Temp: 98.5 F (36.9 C)  SpO2: 99%      Assessment & Plan:   Sinusitis --patient will take doxycycline twice daily for 10 days.  Advised to continue Allegra-D as this can help drive congestion and get sinuses draining.  Increase fluids, rest, do good handwashing.  Cough- suspect cough is related to postnasal drip.  Lung sounds are clear.  Patient will use Tessalon Perles as needed for cough control.  Keep regularly scheduled follow-up as planned.  Return to clinic sooner if issues arise.

## 2018-04-22 NOTE — Patient Instructions (Signed)
Continue Allegra D as you have been taking to help congestion and to get things draining.   Sinus Rinse What is a sinus rinse? A sinus rinse is a home treatment. It rinses your sinuses with a mixture of salt and water (saline solution). Sinuses are air-filled spaces in your skull behind the bones of your face and forehead. They open into your nasal cavity. To do a sinus rinse, you will need:  Saline solution.  Neti pot or spray bottle. This releases the saline solution into your nose and through your sinuses. You can buy neti pots and spray bottles at: ? [facility] fund[facility] local pharmacy. ? A health food store. ? Online.  When should I do a sinus rinse? A sinus rinse can help to clear your nasal cavity. It can clear:  Mucus.  Dirt.  Dust.  Pollen.  You may do a sinus rinse when you have:  A cold.  A virus.  Allergies.  A sinus infection.  A stuffy nose.  If you are considering a sinus rinse:  Ask your child's doctor before doing a sinus rinse on your child.  Do not do a sinus rinse if you have had: ? Ear or nasal surgery. ? An ear infection. ? Blocked ears.  How do I do a sinus rinse?  Wash your hands.  Disinfect your device using the directions that came with the device.  Dry your device.  Use the solution that comes with your device or one that is sold separately in stores. Follow the mixing directions on the package.  Fill your device with the amount of saline solution as stated in the device instructions.  Stand over a sink and tilt your head sideways over the sink.  Place the spout of the device in your upper nostril (the one closer to the ceiling).  Gently pour or squeeze the saline solution into the nasal cavity. The liquid should drain to the lower nostril if you are not too congested.  Gently blow your nose. Blowing too hard may cause ear pain.  Repeat in the other nostril.  Clean and rinse your device with clean water.  Air-dry your device. Are there  risks of a sinus rinse? Sinus rinse is normally very safe and helpful. However, there are a few risks, which include:  A burning feeling in the sinuses. This may happen if you do not make the saline solution as instructed. Make sure to follow all directions when making the saline solution.  Infection from unclean water. This is rare, but possible.  Nasal irritation.  This information is not intended to replace advice given to you by your health care provider. Make sure you discuss any questions you have with your health care provider. Document Released: 02/21/2014 Document Revised: 06/23/2016 Document Reviewed: 12/12/2013 Elsevier Interactive Patient Education  2017 [employer]

## 2018-05-31 DIAGNOSIS — Z23 Encounter for immunization: Secondary | ICD-10-CM | POA: Diagnosis not present

## 2018-06-24 DIAGNOSIS — H5213 Myopia, bilateral: Secondary | ICD-10-CM | POA: Diagnosis not present

## 2018-07-04 ENCOUNTER — Encounter: Payer: Self-pay | Admitting: Family Medicine

## 2018-07-04 ENCOUNTER — Ambulatory Visit (INDEPENDENT_AMBULATORY_CARE_PROVIDER_SITE_OTHER): Payer: BLUE CROSS/BLUE SHIELD | Admitting: Family Medicine

## 2018-07-04 ENCOUNTER — Ambulatory Visit: Payer: BLUE CROSS/BLUE SHIELD | Admitting: Family Medicine

## 2018-07-04 DIAGNOSIS — F329 Major depressive disorder, single episode, unspecified: Secondary | ICD-10-CM | POA: Diagnosis not present

## 2018-07-04 DIAGNOSIS — F32A Depression, unspecified: Secondary | ICD-10-CM

## 2018-07-04 MED ORDER — DULOXETINE HCL 30 MG PO CPEP
30.0000 mg | ORAL_CAPSULE | Freq: Every day | ORAL | 1 refills | Status: DC
Start: 2018-07-04 — End: 2018-10-04

## 2018-07-04 NOTE — Progress Notes (Signed)
   Subjective:    Patient ID: Kendra Freeman, female    DOB: 06/19/1986, 32 y.o.   MRN: [id number]  HPI  Presents to clinic for 3 month follow up on mood while taking cymbalta 30 mg and hydroxyzine 10 mg PRN.  Patient states her mood has remained stable on the Cymbalta 30 mg dose.  Denies any issues with breakthrough crying, or feeling very down or depressed.  She has been able to enjoy this football season with her husband, usually football season is a very stressful time for her, however at this season seem to be more fun.  Patient has not needed to use any of the hydroxyzine over the past 6 weeks.  Denies any SI or HI.  Patient Active Problem List   Diagnosis Date Noted  . Depressive disorder 03/11/2018  . Anxiety 03/11/2018  . Vulvar lesion 01/20/2018  . Skin tag 01/20/2018  . Vitamin D deficiency 01/06/2018  . Skin tag of female perineum 01/06/2018  . Fatigue 01/06/2018   Social History   Tobacco Use  . Smoking status: Former Games developermoker  . Smokeless tobacco: Never Used  Substance Use Topics  . Alcohol use: No   Review of Systems   Constitutional: Negative for chills, fatigue and fever.  HENT: Negative for congestion, ear pain, sinus pain and sore throat.   Eyes: Negative.   Respiratory: Negative for cough, shortness of breath and wheezing.   Cardiovascular: Negative for chest pain, palpitations and leg swelling.  Gastrointestinal: Negative for abdominal pain, diarrhea, nausea and vomiting.  Genitourinary: Negative for dysuria, frequency and urgency.  Musculoskeletal: Negative for arthralgias and myalgias.  Skin: Negative for color change, pallor and rash.  Neurological: Negative for syncope, light-headedness and headaches.  Psychiatric/Behavioral: The patient is not nervous/anxious.       Objective:   Physical Exam  Constitutional: She is oriented to person, place, and time. No distress.  HENT:  Head: Normocephalic and atraumatic.  Eyes: Conjunctivae and EOM are  normal. No scleral icterus.  Neck: Neck supple. No tracheal deviation present.  Cardiovascular: Normal rate and regular rhythm.  Pulmonary/Chest: Effort normal and breath sounds normal. No respiratory distress.  Musculoskeletal:  Gait normal.   Neurological: She is alert and oriented to person, place, and time.  Skin: Skin is warm and dry. She is not diaphoretic. No pallor.  Psychiatric: She has a normal mood and affect. Her behavior is normal. Judgment and thought content normal.  Nursing note and vitals reviewed.     Vitals:   07/04/18 1317  BP: 138/82  Pulse: 84  Temp: 98.4 F (36.9 C)  SpO2: 98%   Assessment & Plan:   Depressive disorder -- patient's mood remains stable on Cymbalta 30 mg.  Patient will continue this dose.  She also has hydroxyzine around if needed for any sort of breakthrough anxiety.  Patient will follow-up here in 3 months for recheck on mood.  Patient is aware she can return to clinic sooner if any issues arise.

## 2018-07-14 ENCOUNTER — Telehealth: Payer: Self-pay | Admitting: Obstetrics & Gynecology

## 2018-07-14 NOTE — Telephone Encounter (Signed)
Patient is [facility]/18 [facility]

## 2018-07-27 ENCOUNTER — Ambulatory Visit (INDEPENDENT_AMBULATORY_CARE_PROVIDER_SITE_OTHER): Payer: BLUE CROSS/BLUE SHIELD | Admitting: Obstetrics & Gynecology

## 2018-07-27 ENCOUNTER — Encounter: Payer: Self-pay | Admitting: Obstetrics & Gynecology

## 2018-07-27 VITALS — BP 120/80 | Ht 64.0 in | Wt 189.0 lb

## 2018-07-27 DIAGNOSIS — Z30433 Encounter for removal and reinsertion of intrauterine contraceptive device: Secondary | ICD-10-CM | POA: Diagnosis not present

## 2018-07-27 NOTE — Patient Instructions (Signed)
Intrauterine Device Insertion, Care After    This sheet gives you information about how to care for yourself after your procedure. Your health care provider may also give you more specific instructions. If you have problems or questions, contact your health care provider.  What can I expect after the procedure?  After the procedure, it is common to have:  · Cramps and pain in the abdomen.  · Light bleeding (spotting) or heavier bleeding that is like your menstrual period. This may last for up to a few days.  · Lower back pain.  · Dizziness.  · Headaches.  · Nausea.  Follow these instructions at home:  · Before resuming sexual activity, check to make sure that you can feel the IUD string(s). You should be able to feel the end of the string(s) below the opening of your cervix. If your IUD string is in place, you may resume sexual activity.  ? If you had a hormonal IUD inserted more than 7 days after your most recent period started, you will need to use a backup method of birth control for 7 days after IUD insertion. Ask your health care provider whether this applies to you.  · Continue to check that the IUD is still in place by feeling for the string(s) after every menstrual period, or once a month.  · Take over-the-counter and prescription medicines only as told by your health care provider.  · Do not drive or use heavy machinery while taking prescription pain medicine.  · Keep all follow-up visits as told by your health care provider. This is important.  Contact a health care provider if:  · You have bleeding that is heavier or lasts longer than a normal menstrual cycle.  · You have a fever.  · You have cramps or abdominal pain that get worse or do not get better with medicine.  · You develop abdominal pain that is new or is not in the same area of earlier cramping and pain.  · You feel lightheaded or weak.  · You have abnormal or bad-smelling discharge from your vagina.  · You have pain during sexual  activity.  · You have any of the following problems with your IUD string(s):  ? The string bothers or hurts you or your sexual partner.  ? You cannot feel the string.  ? The string has gotten longer.  · You can feel the IUD in your vagina.  · You think you may be pregnant, or you miss your menstrual period.  · You think you may have an STI (sexually transmitted infection).  Get help right away if:  · You have flu-like symptoms.  · You have a fever and chills.  · You can feel that your IUD has slipped out of place.  Summary  · After the procedure, it is common to have cramps and pain in the abdomen. It is also common to have light bleeding (spotting) or heavier bleeding that is like your menstrual period.  · Continue to check that the IUD is still in place by feeling for the string(s) after every menstrual period, or once a month.  · Keep all follow-up visits as told by your health care provider. This is important.  · Contact your health care provider if you have problems with your IUD string(s), such as the string getting longer or bothering you or your sexual partner.  This information is not intended to replace advice given to you by your health care provider. Make   sure you discuss any questions you have with your health care provider.  Document Released: 03/25/2011 Document Revised: 06/17/2016 Document Reviewed: 06/17/2016  Elsevier Interactive Patient Education © 2019 Elsevier Inc.

## 2018-07-27 NOTE — Progress Notes (Signed)
  History of Present Illness:  Kendra Freeman is a 32 y.o. that had a Mirena IUD placed approximately 5 years ago. Since that time, she states that she has had rare period and prefers this form of birth control.  The following portions of the patient's history were reviewed and updated as appropriate: allergies, current medications, past family history, past medical history, past social history, past surgical history and problem list.  Patient Active Problem List   Diagnosis Date Noted  . Depressive disorder 03/11/2018  . Anxiety 03/11/2018  . Vulvar lesion 01/20/2018  . Skin tag 01/20/2018  . Vitamin D deficiency 01/06/2018  . Skin tag of female perineum 01/06/2018  . Fatigue 01/06/2018   Medications:  Current Outpatient Medications on File Prior to Visit  Medication Sig Dispense Refill  . hydrOXYzine (ATARAX/VISTARIL) 10 MG tablet Take 1 tablet (10 mg total) by mouth 3 (three) times daily as needed for anxiety. 30 tablet 3  . levonorgestrel (MIRENA) 20 MCG/24HR IUD 1 each by Intrauterine route once.    . DULoxetine (CYMBALTA) 30 MG capsule Take 1 capsule (30 mg total) by mouth daily. (Patient not taking: Reported on 07/27/2018) 90 capsule 1   No current facility-administered medications on file prior to visit.    Allergies: is allergic to amoxicillin; zithromax [azithromycin dihydrate]; and azithromycin.  Physical Exam:  BP 120/80   Ht 5\' 4"  (1.626 m)   Wt 189 lb (85.7 kg)   BMI 32.44 kg/m  Body mass index is 32.44 kg/m. Constitutional: Well nourished, well developed female in no acute distress.  Abdomen: diffusely non tender to palpation, non distended, and no masses, hernias Neuro: Grossly intact Psych:  Normal mood and affect.    Pelvic exam:  Two IUD strings present seen coming from the cervical os. EGBUS, vaginal vault and cervix: within normal limits  IUD Removal Strings of IUD identified and grasped.  IUD removed without problem.  Pt tolerated this well.  IUD noted  to be intact.  IUD Insertion Procedure Note Patient identified, informed consent performed, consent signed.   Discussed risks of irregular bleeding, cramping, infection, malpositioning or misplacement of the IUD outside the uterus which may require further procedure such as laparoscopy, risk of failure <1%. Time out was performed.  Urine pregnancy test negative.  A bimanual exam showed the uterus to be midposition.  Speculum placed in the vagina.  Cervix visualized.  Cleaned with Betadine x 2.  Grasped anteriorly with a single tooth tenaculum.  Uterus sounded to 7 cm.   IUD placed per manufacturer's recommendations.  Strings trimmed to 3 cm. Tenaculum was removed, good hemostasis noted.  Patient tolerated procedure well.   Patient was given post-procedure instructions.  Patient was also asked to check IUD strings periodically and follow up in 4 weeks for IUD check.  Annamarie MajorPaul Diamonds Lippard, MD, Merlinda FrederickFACOG [facility], [facility] 07/27/2018  1:59 PM

## 2018-08-10 HISTORY — PX: INTRAUTERINE DEVICE (IUD) INSERTION: SHX5877

## 2018-08-25 ENCOUNTER — Other Ambulatory Visit (HOSPITAL_COMMUNITY)
Admission: RE | Admit: 2018-08-25 | Discharge: 2018-08-25 | Disposition: A | Discharge status: Home / Self Care | Payer: BLUE CROSS/BLUE SHIELD | Source: Ambulatory Visit | Attending: Obstetrics & Gynecology | Admitting: Obstetrics & Gynecology

## 2018-08-25 ENCOUNTER — Ambulatory Visit (INDEPENDENT_AMBULATORY_CARE_PROVIDER_SITE_OTHER): Payer: BLUE CROSS/BLUE SHIELD | Admitting: Obstetrics & Gynecology

## 2018-08-25 ENCOUNTER — Encounter: Payer: Self-pay | Admitting: Obstetrics & Gynecology

## 2018-08-25 VITALS — BP 120/80 | Ht 64.0 in | Wt 189.0 lb

## 2018-08-25 DIAGNOSIS — Z30431 Encounter for routine checking of intrauterine contraceptive device: Secondary | ICD-10-CM

## 2018-08-25 DIAGNOSIS — Z124 Encounter for screening for malignant neoplasm of cervix: Secondary | ICD-10-CM

## 2018-08-25 NOTE — Progress Notes (Signed)
  History of Present Illness:  Kendra Freeman is a 33 y.o. that had a Mirena IUD placed approximately 4 weeks ago. Since that time, she states that she has had no bleeding and pain  PMHx: She  has a past medical history of ADD (attention deficit disorder), Attention deficit disorder, Major depression, PMS (premenstrual syndrome), and Pseudotumor cerebri. Also,  has a past surgical history that includes Adenoidectomy; Cesarean section; Inner ear surgery; and Tonsillectomy., family history includes Diabetes in her father; Heart disease in her maternal grandfather; Hodgkin's lymphoma in her maternal grandmother; Hypertension in her paternal grandfather; Lymphoma in her maternal grandmother.,  reports that she has quit smoking. She has never used smokeless tobacco. She reports that she does not drink alcohol or use drugs. No outpatient medications have been marked as taking for the 08/25/18 encounter (Office Visit) with Nadara Mustard, MD.  .  Also, is allergic to amoxicillin; zithromax [azithromycin dihydrate]; and azithromycin..  Review of Systems  All other systems reviewed and are negative.  Physical Exam:  There were no vitals taken for this visit. There is no height or weight on file to calculate BMI. Constitutional: Well nourished, well developed female in no acute distress.  Abdomen: diffusely non tender to palpation, non distended, and no masses, hernias Neuro: Grossly intact Psych:  Normal mood and affect.    Pelvic exam:  Two IUD strings present seen coming from the cervical os. EGBUS, vaginal vault and cervix: within normal limits  Assessment: IUD strings present in proper location; pt doing well  Plan: She was told to continue to use barrier contraception, in order to prevent any STIs, and to take a home pregnancy test or call us if she ever thinks she may be pregnant, and that her IUD expires in 5 years.  PAP due soon so done today.  She was amenable to this plan and we will  see her back in 1 year/PRN.  A total of 15 minutes were spent face-to-face with the patient during this encounter and over half of that time dealt with counseling and coordination of care.  Annamarie Major, MD, [name], [facility] 08/25/2018  8:21 AM

## 2018-08-31 LAB — CYTOLOGY - PAP
Diagnosis: NEGATIVE
HPV: NOT DETECTED

## 2018-09-28 DIAGNOSIS — G43109 Migraine with aura, not intractable, without status migrainosus: Secondary | ICD-10-CM | POA: Diagnosis not present

## 2018-10-04 ENCOUNTER — Ambulatory Visit (INDEPENDENT_AMBULATORY_CARE_PROVIDER_SITE_OTHER): Payer: BLUE CROSS/BLUE SHIELD | Admitting: Family Medicine

## 2018-10-04 ENCOUNTER — Encounter: Payer: Self-pay | Admitting: Family Medicine

## 2018-10-04 VITALS — BP 100/64 | HR 82 | Temp 99.2°F | Resp 16 | Ht 64.0 in | Wt 176.0 lb

## 2018-10-04 DIAGNOSIS — F329 Major depressive disorder, single episode, unspecified: Secondary | ICD-10-CM

## 2018-10-04 DIAGNOSIS — F419 Anxiety disorder, unspecified: Secondary | ICD-10-CM

## 2018-10-04 DIAGNOSIS — F32A Depression, unspecified: Secondary | ICD-10-CM

## 2018-10-04 MED ORDER — DULOXETINE HCL 30 MG PO CPEP
30.0000 mg | ORAL_CAPSULE | Freq: Every day | ORAL | 1 refills | Status: DC
Start: 2018-10-04 — End: 2019-03-24

## 2018-10-04 NOTE — Progress Notes (Signed)
   Subjective:    Patient ID: Kendra Freeman, female    DOB: [birth year], 33 y.o.   MRN: [id number]  HPI   Patient presents to clinic for follow-up on anxiety and depression while taking Cymbalta.  Patient feels well on 30 mg once daily dose.  Has not had to use hydroxyzine in the past 3 months.  Does like having it around if needed for use for anxiety.  Denies any SI or HI.  Life is very busy right now, currently her son is playing basketball and also starting soccer.  Son is very involved in sports with his school, patient is involved with all of her son sporting events.  Patient's husband is the Runner, broadcasting/film/video, so he is often very busy.  Patient continues to be a member of a coach his wife's group on Facebook, and enjoys talking with other women about their experiences.  Patient Active Problem List   Diagnosis Date Noted  . Depressive disorder 03/11/2018  . Anxiety 03/11/2018  . Vulvar lesion 01/20/2018  . Skin tag 01/20/2018  . Vitamin D deficiency 01/06/2018  . Skin tag of female perineum 01/06/2018  . Fatigue 01/06/2018   Social History   Tobacco Use  . Smoking status: Former Games developer  . Smokeless tobacco: Never Used  Substance Use Topics  . Alcohol use: No    Review of Systems   Constitutional: Negative for chills, fatigue and fever.  HENT: Negative for congestion, ear pain, sinus pain and sore throat.   Eyes: Negative.   Respiratory: Negative for cough, shortness of breath and wheezing.   Cardiovascular: Negative for chest pain, palpitations and leg swelling.  Gastrointestinal: Negative for abdominal pain, diarrhea, nausea and vomiting.  Genitourinary: Negative for dysuria, frequency and urgency.  Musculoskeletal: Negative for arthralgias and myalgias.  Skin: Negative for color change, pallor and rash.  Neurological: Negative for syncope, light-headedness and headaches.  Psychiatric/Behavioral: The patient is not nervous/anxious.       Objective:   Physical  Exam  Constitutional: She appears well-developed and well-nourished. No distress.  HENT:  Head: Normocephalic and atraumatic.  Eyes: EOM are normal. No scleral icterus.  Neck: Normal range of motion. Neck supple. No tracheal deviation present.  Cardiovascular: Normal rate, regular rhythm and normal heart sounds.  Pulmonary/Chest: Effort normal and breath sounds normal. No respiratory distress. Neurological: She is alert and oriented to person, place, and time.  Gait normal  Skin: Skin is warm and dry. No pallor.  Psychiatric: She has a normal mood and affect. Her behavior is normal. Thought content normal.   Nursing note and vitals reviewed.   Vitals:   10/04/18 1308  BP: 100/64  Pulse: 82  Resp: 16  Temp: 99.2 F (37.3 C)  SpO2: 99%      Assessment & Plan:   Anxiety and depression - she will continue Cymbalta at 30 mg dosing.  She will use hydroxyzine if needed.  Patient is enjoying spending time with her family and also feels she has good support from friends she has made via the Facebook group.  Patient will return to clinic in 6 months for follow-up on anxiety and depression.  Advised she can return to clinic sooner if any issues arise.

## 2018-12-15 ENCOUNTER — Telehealth: Payer: BLUE CROSS/BLUE SHIELD | Admitting: Family

## 2018-12-15 DIAGNOSIS — J019 Acute sinusitis, unspecified: Secondary | ICD-10-CM | POA: Diagnosis not present

## 2018-12-15 DIAGNOSIS — B9689 Other specified bacterial agents as the cause of diseases classified elsewhere: Secondary | ICD-10-CM

## 2018-12-15 DIAGNOSIS — J208 Acute bronchitis due to other specified organisms: Secondary | ICD-10-CM | POA: Diagnosis not present

## 2018-12-15 MED ORDER — ALBUTEROL SULFATE HFA 108 (90 BASE) MCG/ACT IN AERS: 2.0000 | INHALATION_SPRAY | Freq: Four times a day (QID) | RESPIRATORY_TRACT | 1 refills | Status: DC | PRN

## 2018-12-15 MED ORDER — DOXYCYCLINE HYCLATE 100 MG PO TABS: 100.0000 mg | ORAL_TABLET | Freq: Two times a day (BID) | ORAL | 0 refills | Status: DC

## 2018-12-15 MED ORDER — BENZONATATE 100 MG PO CAPS: 100.0000 mg | ORAL_CAPSULE | Freq: Three times a day (TID) | ORAL | 0 refills | Status: DC | PRN

## 2018-12-15 NOTE — Progress Notes (Signed)
Greater than 5 minutes, yet less than 10 minutes of time have been spent researching, coordinating, and implementing care for this patient today.  Thank you for the details you included in the comment boxes. Those details are very helpful in determining the best course of treatment for you and help us to provide the best care.  Prednisone does not improve sinus problems, yet it can help with your breathing and cough. See plan below. The antibiotics I am sending will actually treat sinus and lung infections.   We are sorry that you are not feeling well.  Here is how we plan to help!  Based on your presentation I believe you most likely have A cough due to bacteria.  When patients have a fever and a productive cough with a change in color or increased sputum production, we are concerned about bacterial bronchitis.  If left untreated it can progress to pneumonia.  If your symptoms do not improve with your treatment plan it is important that you contact your provider.   I have prescribed Doxycycline 100 mg twice a day for 7 days     In addition you may use A non-prescription cough medication called Mucinex DM: take 2 tablets every 12 hours. and A prescription cough medication called Tessalon Perles 100mg . You may take 1-2 capsules every 8 hours as needed for your cough.  I have also added an Albuterol inhaler, take 2 puffs every 6 hours as needed for shortness of breath.   From your responses in the eVisit questionnaire you describe inflammation in the upper respiratory tract which is causing a significant cough.  This is commonly called Bronchitis and has four common causes:    Allergies  Viral Infections  Acid Reflux  Bacterial Infection Allergies, viruses and acid reflux are treated by controlling symptoms or eliminating the cause. An example might be a cough caused by taking certain blood pressure medications. You stop the cough by changing the medication. Another example might be a cough  caused by acid reflux. Controlling the reflux helps control the cough.  USE OF BRONCHODILATOR ("RESCUE") INHALERS: There is a risk from using your bronchodilator too frequently.  The risk is that over-reliance on a medication which only relaxes the muscles surrounding the breathing tubes can reduce the effectiveness of medications prescribed to reduce swelling and congestion of the tubes themselves.  Although you feel brief relief from the bronchodilator inhaler, your asthma may actually be worsening with the tubes becoming more swollen and filled with mucus.  This can delay other crucial treatments, such as oral steroid medications. If you need to use a bronchodilator inhaler daily, several times per day, you should discuss this with your provider.  There are probably better treatments that could be used to keep your asthma under control.     HOME CARE . Only take medications as instructed by your medical team. . Complete the entire course of an antibiotic. . Drink plenty of fluids and get plenty of rest. . Avoid close contacts especially the very young and the elderly . Cover your mouth if you cough or cough into your sleeve. . Always remember to wash your hands . A steam or ultrasonic humidifier can help congestion.   GET HELP RIGHT AWAY IF: . You develop worsening fever. . You become short of breath . You cough up blood. . Your symptoms persist after you have completed your treatment plan MAKE SURE YOU   Understand these instructions.  Will watch your condition.  Will  get help right away if you are not doing well or get worse.  Your e-visit answers were reviewed by a board certified advanced clinical practitioner to complete your personal care plan.  Depending on the condition, your plan could have included both over the counter or prescription medications. If there is a problem please reply  once you have received a response from your provider. Your safety is important to [place].  If  you have drug allergies check your prescription carefully.    You can use MyChart to ask questions about today's visit, request a non-urgent call back, or ask for a work or school excuse for 24 hours related to this e-Visit. If it has been greater than 24 hours you will need to follow up with your provider, or enter a new e-Visit to address those concerns. You will get an e-mail in the next two days asking about your experience.  I hope that your e-visit has been valuable and will speed your recovery. Thank you for using e-visits.

## 2019-03-24 ENCOUNTER — Encounter: Payer: Self-pay | Admitting: Family Medicine

## 2019-03-24 DIAGNOSIS — F32A Depression, unspecified: Secondary | ICD-10-CM

## 2019-03-24 DIAGNOSIS — F419 Anxiety disorder, unspecified: Secondary | ICD-10-CM

## 2019-03-24 DIAGNOSIS — F329 Major depressive disorder, single episode, unspecified: Secondary | ICD-10-CM

## 2019-03-24 MED ORDER — DULOXETINE HCL 30 MG PO CPEP: 30.0000 mg | ORAL_CAPSULE | Freq: Every day | ORAL | 1 refills | Status: DC

## 2019-03-24 MED ORDER — HYDROXYZINE HCL 10 MG PO TABS: 10.0000 mg | ORAL_TABLET | Freq: Three times a day (TID) | ORAL | 3 refills | Status: DC | PRN

## 2019-04-04 ENCOUNTER — Encounter: Payer: Self-pay | Admitting: Family Medicine

## 2019-04-04 ENCOUNTER — Other Ambulatory Visit: Payer: Self-pay

## 2019-04-04 ENCOUNTER — Ambulatory Visit (INDEPENDENT_AMBULATORY_CARE_PROVIDER_SITE_OTHER): Payer: BC Managed Care – PPO | Admitting: Family Medicine

## 2019-04-04 VITALS — Ht 64.0 in | Wt 176.0 lb

## 2019-04-04 DIAGNOSIS — F329 Major depressive disorder, single episode, unspecified: Secondary | ICD-10-CM

## 2019-04-04 DIAGNOSIS — F419 Anxiety disorder, unspecified: Secondary | ICD-10-CM

## 2019-04-04 DIAGNOSIS — F32A Depression, unspecified: Secondary | ICD-10-CM

## 2019-04-04 NOTE — Progress Notes (Signed)
Patient ID: Kendra Freeman, female   DOB: 21-Aug-1985, 33 y.o.   MRN: [id number]    Virtual Visit via video Note  This visit type was conducted due to national recommendations for restrictions regarding the COVID-19 pandemic (e.g. social distancing).  This format is felt to be most appropriate for this patient at this time.  All issues noted in this document were discussed and addressed.  No physical exam was performed (except for noted visual exam findings with Video Visits).   I connected with Kendra Freeman today at  1:00 PM EDT by a video enabled telemedicine application or telephone and verified that I am speaking with the correct person using two identifiers. Location patient: home Location provider: work or home office Persons participating in the virtual visit: patient, provider  I discussed the limitations, risks, security and privacy concerns of performing an evaluation and management service by video and the availability of in person appointments. I also discussed with the patient that there may be a patient responsible charge related to this service. The patient expressed understanding and agreed to proceed.   HPI:  Patient and I connected via video to follow-up on anxiety and depression while taking Cymbalta.  Patient feels well on her current dose and thinks that it is effective for her.  Denies feeling overly anxious or to down or depressed.  Does have times of increased stress in her life, but overall feels she handles them well.  Likes the Cymbalta because she feels like a better version of herself.  She has been working remotely from home since March, overall this is gone well.  She has good support from her husband.  She is happy that her 51-year-old son is able to attend kindergarten 2 to 3 days a week, really thinks her young son missed the interaction with children his age and so far it is gone well since starting school 1 week ago.     ROS: See pertinent positives and negatives  per HPI.  Past Medical History:  Diagnosis Date  . ADD (attention deficit disorder)   . Attention deficit disorder   . Major depression   . PMS (premenstrual syndrome)   . Pseudotumor cerebri     Past Surgical History:  Procedure Laterality Date  . ADENOIDECTOMY    . CESAREAN SECTION    . INNER EAR SURGERY    . TONSILLECTOMY      Family History  Problem Relation Age of Onset  . Hodgkin's lymphoma Maternal Grandmother   . Lymphoma Maternal Grandmother   . Hypertension Paternal Grandfather   . Diabetes Father        Type 2  . Heart disease Maternal Grandfather    Social History   Tobacco Use  . Smoking status: Former Research scientist (life sciences)  . Smokeless tobacco: Never Used  Substance Use Topics  . Alcohol use: No    Current Outpatient Medications:  .  DULoxetine (CYMBALTA) 30 MG capsule, Take 1 capsule (30 mg total) by mouth daily., Disp: 90 capsule, Rfl: 1 .  hydrOXYzine (ATARAX/VISTARIL) 10 MG tablet, Take 1 tablet (10 mg total) by mouth 3 (three) times daily as needed for anxiety., Disp: 30 tablet, Rfl: 3 .  levonorgestrel (MIRENA) 20 MCG/24HR IUD, 1 each by Intrauterine route once., Disp: , Rfl:  .  albuterol (VENTOLIN HFA) 108 (90 Base) MCG/ACT inhaler, Inhale 2 puffs into the lungs every 6 (six) hours as needed for wheezing or shortness of breath. (Patient not taking: Reported on 04/04/2019), Disp: 1  Inhaler, Rfl: 1  EXAM:  GENERAL: alert, oriented, appears well and in no acute distress  HEENT: atraumatic, conjunttiva clear, no obvious abnormalities on inspection of external nose and ears  NECK: normal movements of the head and neck  LUNGS: on inspection no signs of respiratory distress, breathing rate appears normal, no obvious gross SOB, gasping or wheezing  CV: no obvious cyanosis  MS: moves all visible extremities without noticeable abnormality  PSYCH/NEURO: pleasant and cooperative, no obvious depression or anxiety, speech and thought processing grossly intact   ASSESSMENT AND PLAN:  Discussed the following assessment and plan:  Anxiety and depression - patient's mood is well controlled on Cymbalta dose.  She will continue this medication.  She does not use hydroxyzine often, does not require refill of this.  Has good support from her husband and friends.  Overall feels she is handling the additional stressors of the pandemic well.  We will plan to follow-up in 6 months for recheck and we will do annual lab work at that time.  Patient aware she can call office anytime with questions or concerns.  Discussed flu vaccine, states she usually gets through her job but will let us know if she wants to make an appointment here for flu vaccine.   I discussed the assessment and treatment plan with the patient. The patient was provided an opportunity to ask questions and all were answered. The patient agreed with the plan and demonstrated an understanding of the instructions.   The patient was advised to call back or seek an in-person evaluation if the symptoms worsen or if the condition fails to improve as anticipated.  Tracey HarriesLauren M Kynlea Blackston, FNP

## 2019-07-11 DIAGNOSIS — Z8759 Personal history of other complications of pregnancy, childbirth and the puerperium: Secondary | ICD-10-CM

## 2019-07-11 HISTORY — DX: Personal history of other complications of pregnancy, childbirth and the puerperium: Z87.59

## 2019-08-07 ENCOUNTER — Other Ambulatory Visit: Payer: Self-pay

## 2019-08-07 ENCOUNTER — Ambulatory Visit (INDEPENDENT_AMBULATORY_CARE_PROVIDER_SITE_OTHER): Payer: BLUE CROSS/BLUE SHIELD | Admitting: Obstetrics and Gynecology

## 2019-08-07 ENCOUNTER — Encounter: Payer: Self-pay | Admitting: Obstetrics and Gynecology

## 2019-08-07 VITALS — BP 138/88 | HR 97 | Ht 64.0 in | Wt 153.0 lb

## 2019-08-07 DIAGNOSIS — O09299 Supervision of pregnancy with other poor reproductive or obstetric history, unspecified trimester: Secondary | ICD-10-CM | POA: Insufficient documentation

## 2019-08-07 DIAGNOSIS — N912 Amenorrhea, unspecified: Secondary | ICD-10-CM

## 2019-08-07 DIAGNOSIS — T8331XA Breakdown (mechanical) of intrauterine contraceptive device, initial encounter: Secondary | ICD-10-CM

## 2019-08-07 DIAGNOSIS — Z331 Pregnant state, incidental: Secondary | ICD-10-CM | POA: Diagnosis not present

## 2019-08-07 DIAGNOSIS — Z30432 Encounter for removal of intrauterine contraceptive device: Secondary | ICD-10-CM

## 2019-08-07 DIAGNOSIS — Z3201 Encounter for pregnancy test, result positive: Secondary | ICD-10-CM | POA: Diagnosis not present

## 2019-08-07 DIAGNOSIS — Z3689 Encounter for other specified antenatal screening: Secondary | ICD-10-CM

## 2019-08-07 LAB — POCT URINE PREGNANCY: Preg Test, Ur: POSITIVE — AB

## 2019-08-07 NOTE — Progress Notes (Addendum)
[facility] Visit   Chief Complaint:  Chief Complaint  Patient presents with  . Amenorrhea    positive pregnancy test, Mirena IUD     History of Present Illness: 33 y.o. G1P1001 with Patient's last menstrual period was 06/29/2019 (approximate). with positive home urine pregnancy test in the setting of Mirena IUD placed 07/27/2018.  She recently began having periods again. She has not checked for IUD strings.  She denies nausea, emesis, or bleeding.  Her blood type is O positive.    Her main concern is possibility of birth defects secondary in utero hormone exposure on fetus.   Review of Systems: Review of systems negative unless otherwise noted in HPI  Past Medical History:  Past Medical History:  Diagnosis Date  . ADD (attention deficit disorder)   . Attention deficit disorder   . Major depression   . PMS (premenstrual syndrome)   . Pseudotumor cerebri     Past Surgical History:  Past Surgical History:  Procedure Laterality Date  . ADENOIDECTOMY    . CESAREAN SECTION    . INNER EAR SURGERY    . INTRAUTERINE DEVICE (IUD) INSERTION  08/2018  . TONSILLECTOMY      Gynecologic History: Patient's last menstrual period was 06/29/2019 (approximate).  Obstetric History: G1P1001  Family History:  Family History  Problem Relation Age of Onset  . Hodgkin's lymphoma Maternal Grandmother   . Lymphoma Maternal Grandmother   . Hypertension Paternal Grandfather   . Diabetes Father        Type 2  . Heart disease Maternal Grandfather     Social History:  Social History   Socioeconomic History  . Marital status: Married    Spouse name: Not on file  . Number of children: Not on file  . Years of education: Not on file  . Highest education level: Not on file  Occupational History  . Not on file  Tobacco Use  . Smoking status: Former Games developer  . Smokeless tobacco: Never Used  Substance and Sexual Activity  . Alcohol use: No  . Drug use: No  .  Sexual activity: Yes    Birth control/protection: I.U.D.  Other Topics Concern  . Not on file  Social History Narrative  . Not on file   Social Determinants of Health   Financial Resource Strain:   . Difficulty of Paying Living Expenses: Not on file  Food Insecurity:   . Worried About Programme researcher, broadcasting/film/video in the Last Year: Not on file  . Ran Out of Food in the Last Year: Not on file  Transportation Needs:   . Lack of Transportation (Medical): Not on file  . Lack of Transportation (Non-Medical): Not on file  Physical Activity:   . Days of Exercise per Week: Not on file  . Minutes of Exercise per Session: Not on file  Stress:   . Feeling of Stress : Not on file  Social Connections:   . Frequency of Communication with Friends and Family: Not on file  . Frequency of Social Gatherings with Friends and Family: Not on file  . Attends Religious Services: Not on file  . Active Member of Clubs or Organizations: Not on file  . Attends Banker Meetings: Not on file  . Marital Status: Not on file  Intimate Partner Violence:   . Fear of Current or Ex-Partner: Not on file  . Emotionally Abused: Not on file  . Physically Abused: Not on file  .  Sexually Abused: Not on file    Allergies:  Allergies  Allergen Reactions  . Amoxicillin Hives  . Zithromax [Azithromycin Dihydrate] Hives  . Azithromycin Rash    Medications: Prior to Admission medications   Medication Sig Start Date End Date Taking? Authorizing Provider  albuterol (VENTOLIN HFA) 108 (90 Base) MCG/ACT inhaler Inhale 2 puffs into the lungs every 6 (six) hours as needed for wheezing or shortness of breath. Patient not taking: Reported on 04/04/2019 12/15/18   Beau FannyWithrow, John C, FNP  DULoxetine (CYMBALTA) 30 MG capsule Take 1 capsule (30 mg total) by mouth daily. 03/24/19   Tracey HarriesGuse, Lauren M, FNP  hydrOXYzine (ATARAX/VISTARIL) 10 MG tablet Take 1 tablet (10 mg total) by mouth 3 (three) times daily as needed for anxiety.  03/24/19   Tracey HarriesGuse, Lauren M, FNP  levonorgestrel (MIRENA) 20 MCG/24HR IUD 1 each by Intrauterine route once.    [provider]    Physical Exam Vitals:  Vitals:   08/07/19 0848  BP: 138/88  Pulse: 97   Patient's last menstrual period was 06/29/2019 (approximate).  General: NAD HEENT: normocephalic, anicteric Pulmonary: No increased work of breathing Genitourinary:  External: Normal external female genitalia.  Normal urethral meatus, normal  Bartholin's and Skene's glands.    Vagina: Normal vaginal mucosa, no evidence of prolapse.    Cervix: Grossly normal in appearance, no bleeding  Uterus: Non-enlarged, mobile, normal contour.  No CMT, IUD strings visualized 3cm at external cervical os with distal portion of IUD body also visible  Adnexa: ovaries non-enlarged, no adnexal masses  Rectal: deferred  Lymphatic: no evidence of inguinal lymphadenopathy Extremities: no edema, erythema, or tenderness Neurologic: Grossly intact Psychiatric: mood appropriate, affect full  Female chaperone present for pelvic  portions of the physical exam   GYNECOLOGY OFFICE PROCEDURE NOTE  Kendra MilianMary Jan Freeman is a 33 y.o. G1P1001 here for Mirena removal placed 07/27/2018. She desires removal secondary to positive UPT..  IUD Removal  Patient identified, informed consent performed, consent signed.  Patient was in the dorsal lithotomy position, normal external genitalia was noted.  A speculum was placed in the patient's vagina, normal discharge was noted, no lesions. The cervix was visualized, no lesions, no abnormal discharge.  The strings of the IUD were grasped and pulled using ring forceps. The IUD was removed in its entirety.   Patient tolerated the procedure well.     Assessment: 33 y.o. G1P1001 IUD removal in setting of positive UPT  Plan: Problem List Items Addressed This Visit    None    Visit Diagnoses    Positive urine pregnancy test    -  Primary   Relevant Orders   POCT urine  pregnancy (Completed)   ABO   Antibody screen   US OB Comp Less 14 Wks   Amenorrhea       Relevant Orders   hCG, quantitative, pregnancy   POCT urine pregnancy (Completed)   IUD failure, pregnant       Relevant Orders   US OB Comp Less 14 Wks   Establish gestational age, ultrasound       Relevant Orders   US OB Comp Less 14 Wks     1) IUD removed successfully - discussed that recommendation is for removal is strings visible in the first trimester.  Based on uterine size 6-[redacted] week gestation.  Type and screen sent, A+ per prior pregnancy recrods so not covered with rhogam.    I emphasized that progestin exposure in the first trimester is quite  common and used in the setting of embryo transfers for IVF, has been employed for history of recurrent miscarriages, and does not have any association with birth defects.  I also stressed that the first trimester miscarriage rate is 20% or 1 in 5 pregnancies and that should this pregnancy conclude in a miscarriage it is unrelated to the IUD.    - dating scan next 1-2 days  2) A total of 15 minutes were spent in face-to-face contact with the patient during this encounter with over half of that time devoted to counseling and coordination of care.   3) At present patient is undecided on pregnancy continuation based on prior history of HELLP syndrome as well as AMA status.      Malachy Mood, MD, [facility], [facility] 08/07/2019, 10:52 AM

## 2019-08-08 LAB — BETA HCG QUANT (REF LAB): hCG Quant: 373 m[IU]/mL

## 2019-08-08 LAB — ABO

## 2019-08-08 LAB — ANTIBODY SCREEN: Antibody Screen: NEGATIVE

## 2019-08-09 ENCOUNTER — Other Ambulatory Visit: Payer: Self-pay | Admitting: Obstetrics and Gynecology

## 2019-08-09 ENCOUNTER — Ambulatory Visit
Admission: RE | Admit: 2019-08-09 | Discharge: 2019-08-09 | Disposition: A | Discharge status: Home / Self Care | Payer: BLUE CROSS/BLUE SHIELD | Source: Ambulatory Visit | Attending: Obstetrics and Gynecology | Admitting: Obstetrics and Gynecology

## 2019-08-09 ENCOUNTER — Other Ambulatory Visit: Payer: Self-pay

## 2019-08-09 ENCOUNTER — Ambulatory Visit (INDEPENDENT_AMBULATORY_CARE_PROVIDER_SITE_OTHER): Payer: BLUE CROSS/BLUE SHIELD

## 2019-08-09 DIAGNOSIS — Z3A00 Weeks of gestation of pregnancy not specified: Secondary | ICD-10-CM | POA: Diagnosis not present

## 2019-08-09 DIAGNOSIS — T8331XA Breakdown (mechanical) of intrauterine contraceptive device, initial encounter: Secondary | ICD-10-CM

## 2019-08-09 DIAGNOSIS — Z3689 Encounter for other specified antenatal screening: Secondary | ICD-10-CM

## 2019-08-09 DIAGNOSIS — O00101 Right tubal pregnancy without intrauterine pregnancy: Secondary | ICD-10-CM | POA: Diagnosis not present

## 2019-08-09 DIAGNOSIS — Z3687 Encounter for antenatal screening for uncertain dates: Secondary | ICD-10-CM | POA: Diagnosis not present

## 2019-08-09 DIAGNOSIS — Z3201 Encounter for pregnancy test, result positive: Secondary | ICD-10-CM

## 2019-08-09 DIAGNOSIS — Z331 Pregnant state, incidental: Secondary | ICD-10-CM

## 2019-08-09 LAB — COMPREHENSIVE METABOLIC PANEL
ALT: 13 U/L (ref 0–44)
AST: 15 U/L (ref 15–41)
Albumin: 4.1 g/dL (ref 3.5–5.0)
Alkaline Phosphatase: 46 U/L (ref 38–126)
Anion gap: 9 (ref 5–15)
BUN: 14 mg/dL (ref 6–20)
CO2: 22 mmol/L (ref 22–32)
Calcium: 9 mg/dL (ref 8.9–10.3)
Chloride: 107 mmol/L (ref 98–111)
Creatinine, Ser: 0.8 mg/dL (ref 0.44–1.00)
GFR calc Af Amer: >60 mL/min (ref >60)
GFR calc non Af Amer: >60 mL/min (ref >60)
Glucose, Bld: 97 mg/dL (ref 70–99)
Potassium: 3.9 mmol/L (ref 3.5–5.1)
Sodium: 138 mmol/L (ref 135–145)
Total Bilirubin: 0.8 mg/dL (ref 0.3–1.2)
Total Protein: 7 g/dL (ref 6.5–8.1)

## 2019-08-09 LAB — CBC WITH DIFFERENTIAL/PLATELET
Abs Immature Granulocytes: 0.01 10*3/uL (ref 0.00–0.07)
Basophils Absolute: 0 10*3/uL (ref 0.0–0.1)
Basophils Relative: 1 %
Eosinophils Absolute: 0.1 10*3/uL (ref 0.0–0.5)
Eosinophils Relative: 1 %
HCT: 38.2 % (ref 36.0–46.0)
Hemoglobin: 12.8 g/dL (ref 12.0–15.0)
Immature Granulocytes: 0 %
Lymphocytes Relative: 30 %
Lymphs Abs: 2 10*3/uL (ref 0.7–4.0)
MCH: 29.4 pg (ref 26.0–34.0)
MCHC: 33.5 g/dL (ref 30.0–36.0)
MCV: 87.6 fL (ref 80.0–100.0)
Monocytes Absolute: 0.5 10*3/uL (ref 0.1–1.0)
Monocytes Relative: 7 %
Neutro Abs: 4 10*3/uL (ref 1.7–7.7)
Neutrophils Relative %: 61 %
Platelets: 315 10*3/uL (ref 150–400)
RBC: 4.36 MIL/uL (ref 3.87–5.11)
RDW: 12.3 % (ref 11.5–15.5)
WBC: 6.5 10*3/uL (ref 4.0–10.5)
nRBC: 0 % (ref 0.0–0.2)

## 2019-08-09 LAB — HCG, QUANTITATIVE, PREGNANCY: hCG, Beta Chain, Quant, S: 237 m[IU]/mL — ABNORMAL HIGH (ref <5)

## 2019-08-09 MED ORDER — METHOTREXATE FOR ECTOPIC PREGNANCY
50.0000 mg/m2 | Freq: Once | INTRAMUSCULAR | Status: DC
  Filled 2019-08-09: qty 1

## 2019-08-09 NOTE — Progress Notes (Signed)
Dr Georgianne Fick notified of HCG result of 237.  Orders to proceed with injection

## 2019-08-09 NOTE — Discharge Instructions (Signed)
Methotrexate injection What is this medicine? METHOTREXATE (METH oh TREX ate) is a chemotherapy drug used to treat cancer including breast cancer, leukemia, and lymphoma. This medicine can also be used to treat psoriasis and certain kinds of arthritis. This medicine may be used for other purposes; ask your health care provider or pharmacist if you have questions. What should I tell my health care provider before I take this medicine? They need to know if you have any of these conditions:  fluid in the stomach area or lungs  if you often drink alcohol  infection or immune system problems  kidney disease  liver disease  low blood counts, like low white cell, platelet, or red cell counts  lung disease  radiation therapy  stomach ulcers  ulcerative colitis  an unusual or allergic reaction to methotrexate, other medicines, foods, dyes, or preservatives  pregnant or trying to get pregnant  breast-feeding How should I use this medicine? This medicine is for infusion into a vein or for injection into muscle or into the spinal fluid (whichever applies). It is usually given by a health care professional in a hospital or clinic setting. In rare cases, you might get this medicine at home. You will be taught how to give this medicine. Use exactly as directed. Take your medicine at regular intervals. Do not take your medicine more often than directed. If this medicine is used for arthritis or psoriasis, it should be taken weekly, NOT daily. It is important that you put your used needles and syringes in a special sharps container. Do not put them in a trash can. If you do not have a sharps container, call your pharmacist or healthcare provider to get one. Talk to your pediatrician regarding the use of this medicine in children. While this drug may be prescribed for children as young as 2 years for selected conditions, precautions do apply. Overdosage: If you think you have taken too much of  this medicine contact a poison control center or emergency room at once. NOTE: This medicine is only for you. Do not share this medicine with others. What if I miss a dose? It is important not to miss your dose. Call your doctor or health care professional if you are unable to keep an appointment. If you give yourself the medicine and you miss a dose, talk with your doctor or health care professional. Do not take double or extra doses. What may interact with this medicine? This medicine may interact with the following medications:  acitretin  aspirin or aspirin-like medicines including salicylates  azathioprine  certain antibiotics like chloramphenicol, penicillin, tetracycline  certain medicines for stomach problems like esomeprazole, omeprazole, pantoprazole  cyclosporine  gold  hydroxychloroquine  live virus vaccines  mercaptopurine  NSAIDs, medicines for pain and inflammation, like ibuprofen or naproxen  other cytotoxic agents  penicillamine  phenylbutazone  phenytoin  probenacid  retinoids such as isotretinoin and tretinoin  steroid medicines like prednisone or cortisone  sulfonamides like sulfasalazine and trimethoprim/sulfamethoxazole  theophylline This list may not describe all possible interactions. Give your health care provider a list of all the medicines, herbs, non-prescription drugs, or dietary supplements you use. Also tell them if you smoke, drink alcohol, or use illegal drugs. Some items may interact with your medicine. What should I watch for while using this medicine? Avoid alcoholic drinks. In some cases, you may be given additional medicines to help with side effects. Follow all directions for their use. This medicine can make you more sensitive to   the sun. Keep out of the sun. If you cannot avoid being in the sun, wear protective clothing and use sunscreen. Do not use sun lamps or tanning beds/booths. You may get drowsy or dizzy. Do not drive,  use machinery, or do anything that needs mental alertness until you know how this medicine affects you. Do not stand or sit up quickly, especially if you are an older patient. This reduces the risk of dizzy or fainting spells. You may need blood work done while you are taking this medicine. Call your doctor or health care professional for advice if you get a fever, chills or sore throat, or other symptoms of a cold or flu. Do not treat yourself. This drug decreases your body's ability to fight infections. Try to avoid being around people who are sick. This medicine may increase your risk to bruise or bleed. Call your doctor or health care professional if you notice any unusual bleeding. Check with your doctor or health care professional if you get an attack of severe diarrhea, nausea and vomiting, or if you sweat a lot. The loss of too much body fluid can make it dangerous for you to take this medicine. Talk to your doctor about your risk of cancer. You may be more at risk for certain types of cancers if you take this medicine. Both men and women must use effective birth control with this medicine. Do not become pregnant while taking this medicine or until at least 1 normal menstrual cycle has occurred after stopping it. Women should inform their doctor if they wish to become pregnant or think they might be pregnant. Men should not father a child while taking this medicine and for 3 months after stopping it. There is a potential for serious side effects to an unborn child. Talk to your health care professional or pharmacist for more information. Do not breast-feed an infant while taking this medicine. What side effects may I notice from receiving this medicine? Side effects that you should report to your doctor or health care professional as soon as possible:  allergic reactions like skin rash, itching or hives, swelling of the face, lips, or tongue  back pain  breathing problems or shortness of  breath  confusion  diarrhea  dry, nonproductive cough  low blood counts - this medicine may decrease the number of white blood cells, red blood cells and platelets. You may be at increased risk of infections and bleeding  mouth sores  redness, blistering, peeling or loosening of the skin, including inside the mouth  seizures  severe headaches  signs of infection - fever or chills, cough, sore throat, pain or difficulty passing urine  signs and symptoms of bleeding such as bloody or black, tarry stools; red or dark-brown urine; spitting up blood or brown material that looks like coffee grounds; red spots on the skin; unusual bruising or bleeding from the eye, gums, or nose  signs and symptoms of kidney injury like trouble passing urine or change in the amount of urine  signs and symptoms of liver injury like dark yellow or brown urine; general ill feeling or flu-like symptoms; light-colored stools; loss of appetite; nausea; right upper belly pain; unusually weak or tired; yellowing of the eyes or skin  stiff neck  vomiting Side effects that usually do not require medical attention (report to your doctor or health care professional if they continue or are bothersome):  dizziness  hair loss  headache  stomach pain  upset stomach This list   may not describe all possible side effects. Call your doctor for medical advice about side effects. You may report side effects to FDA at 1-800-FDA-1088. Where should I keep my medicine? If you are using this medicine at home, you will be instructed on how to store this medicine. Throw away any unused medicine after the expiration date on the label. NOTE: This sheet is a summary. It may not cover all possible information. If you have questions about this medicine, talk to your doctor, pharmacist, or health care provider.  2020 Elsevier/Gold Standard (2017-03-18 13:31:42)   

## 2019-08-10 ENCOUNTER — Encounter: Payer: BLUE CROSS/BLUE SHIELD | Admitting: Advanced Practice Midwife

## 2019-08-12 ENCOUNTER — Other Ambulatory Visit
Admission: RE | Admit: 2019-08-12 | Discharge: 2019-08-12 | Disposition: A | Discharge status: Home / Self Care | Payer: BLUE CROSS/BLUE SHIELD | Source: Ambulatory Visit | Attending: Obstetrics and Gynecology | Admitting: Obstetrics and Gynecology

## 2019-08-12 ENCOUNTER — Other Ambulatory Visit: Payer: Self-pay

## 2019-08-12 DIAGNOSIS — Z3A00 Weeks of gestation of pregnancy not specified: Secondary | ICD-10-CM | POA: Insufficient documentation

## 2019-08-12 DIAGNOSIS — O009 Unspecified ectopic pregnancy without intrauterine pregnancy: Secondary | ICD-10-CM | POA: Insufficient documentation

## 2019-08-12 LAB — HCG, QUANTITATIVE, PREGNANCY: hCG, Beta Chain, Quant, S: 142 m[IU]/mL — ABNORMAL HIGH (ref <5)

## 2019-08-15 ENCOUNTER — Other Ambulatory Visit: Payer: Self-pay | Admitting: Obstetrics and Gynecology

## 2019-08-15 ENCOUNTER — Other Ambulatory Visit
Admission: RE | Admit: 2019-08-15 | Discharge: 2019-08-15 | Disposition: A | Discharge status: Home / Self Care | Payer: BLUE CROSS/BLUE SHIELD | Source: Ambulatory Visit | Attending: Obstetrics and Gynecology | Admitting: Obstetrics and Gynecology

## 2019-08-15 DIAGNOSIS — Z3A00 Weeks of gestation of pregnancy not specified: Secondary | ICD-10-CM | POA: Insufficient documentation

## 2019-08-15 DIAGNOSIS — O00101 Right tubal pregnancy without intrauterine pregnancy: Secondary | ICD-10-CM

## 2019-08-15 LAB — HCG, QUANTITATIVE, PREGNANCY: hCG, Beta Chain, Quant, S: 57 m[IU]/mL — ABNORMAL HIGH (ref <5)

## 2019-08-15 NOTE — Progress Notes (Signed)
Here's your result.Marland Kitchen

## 2019-08-16 ENCOUNTER — Telehealth: Payer: Self-pay | Admitting: Obstetrics and Gynecology

## 2019-08-16 ENCOUNTER — Other Ambulatory Visit: Payer: Self-pay | Admitting: Obstetrics and Gynecology

## 2019-08-16 DIAGNOSIS — O00101 Right tubal pregnancy without intrauterine pregnancy: Secondary | ICD-10-CM

## 2019-08-16 NOTE — Progress Notes (Signed)
Needs HCG sometime mid next week and telephone visit (can also be in person if patient wants to do visit and labs at the same time)

## 2019-08-16 NOTE — Telephone Encounter (Signed)
-----   Message from Vena Austria, MD sent at 08/16/2019  8:10 AM EST ----- Needs HCG sometime mid next week and telephone visit (can also be in person if patient wants to do visit and labs at the same time)

## 2019-08-16 NOTE — Progress Notes (Signed)
hcg 

## 2019-08-16 NOTE — Telephone Encounter (Signed)
Attempt to reach patient to schedule appointment. I was able to let the patient know we were calling to set up an appointment and we got disconnected. Attempt to call back phone number on file not accepting calls at this time

## 2019-08-16 NOTE — Telephone Encounter (Signed)
Called patient to schedule appointment. Phone number on file currently not accpeting calls at the time

## 2019-08-17 NOTE — Telephone Encounter (Signed)
Patient is schedule Tuesday, 08/22/19 for labs and Wednesday, 08/23/19 for telephone visit

## 2019-08-22 ENCOUNTER — Other Ambulatory Visit: Payer: Self-pay

## 2019-08-22 ENCOUNTER — Other Ambulatory Visit: Payer: BLUE CROSS/BLUE SHIELD

## 2019-08-22 DIAGNOSIS — O00101 Right tubal pregnancy without intrauterine pregnancy: Secondary | ICD-10-CM

## 2019-08-23 ENCOUNTER — Ambulatory Visit (INDEPENDENT_AMBULATORY_CARE_PROVIDER_SITE_OTHER): Payer: BLUE CROSS/BLUE SHIELD | Admitting: Obstetrics and Gynecology

## 2019-08-23 DIAGNOSIS — Z30011 Encounter for initial prescription of contraceptive pills: Secondary | ICD-10-CM | POA: Diagnosis not present

## 2019-08-23 DIAGNOSIS — O009 Unspecified ectopic pregnancy without intrauterine pregnancy: Secondary | ICD-10-CM | POA: Diagnosis not present

## 2019-08-23 LAB — BETA HCG QUANT (REF LAB): hCG Quant: 6 m[IU]/mL

## 2019-08-23 MED ORDER — NORGESTIMATE-ETH ESTRADIOL 0.25-35 MG-MCG PO TABS: 1.0000 | ORAL_TABLET | Freq: Every day | ORAL | 3 refills | Status: DC

## 2019-08-23 NOTE — Progress Notes (Signed)
I connected with Kendra Freeman  on 08/23/2019 at  4:30 PM EST by telephone and verified that I am speaking with the correct person using two identifiers.   I discussed the limitations, risks, security and privacy concerns of performing an evaluation and management service by telephone and the availability of in person appointments. I also discussed with the patient that there may be a patient responsible charge related to this service. The patient expressed understanding and agreed to proceed.  The patient was at home I spoke with the patient from my workstation phone The names of people involved in this encounter were: Corrie Dandy Adah Salvage , and [facility]    Obstetrics & Gynecology Office Visit   Chief Complaint: No chief complaint on file.   History of Present Illness: 34 y.o. G1P1001 presenting to discuss her most recent HCG measurement following MTX treatment of ecoptic pregnancy with declining HCG levels.  HCG this week returned at 6 mIU/mL.  No abdominal pain noted.  This was an unintended pregnancy with Mirena IUD which was removed on initial presentation.  She is also interested in discussing contraceptive options moving forward and ultimately like considering BTL or vasectomy but would like coverage in the meantime.    Review of Systems: review of systems negative unless noted in HPI  Past Medical History:  Past Medical History:  Diagnosis Date  . ADD (attention deficit disorder)   . Attention deficit disorder   . Major depression   . PMS (premenstrual syndrome)   . Pseudotumor cerebri     Past Surgical History:  Past Surgical History:  Procedure Laterality Date  . ADENOIDECTOMY    . CESAREAN SECTION    . INNER EAR SURGERY    . INTRAUTERINE DEVICE (IUD) INSERTION  08/2018  . TONSILLECTOMY      Gynecologic History: No LMP recorded. (Menstrual status: IUD).  Obstetric History: G1P1001  Family History:  Family History  Problem Relation Age of Onset  .  Hodgkin's lymphoma Maternal Grandmother   . Lymphoma Maternal Grandmother   . Hypertension Paternal Grandfather   . Diabetes Father        Type 2  . Heart disease Maternal Grandfather     Social History:  Social History   Socioeconomic History  . Marital status: Married    Spouse name: Not on file  . Number of children: Not on file  . Years of education: Not on file  . Highest education level: Not on file  Occupational History  . Not on file  Tobacco Use  . Smoking status: Former Games developer  . Smokeless tobacco: Never Used  Substance and Sexual Activity  . Alcohol use: No  . Drug use: No  . Sexual activity: Yes    Birth control/protection: I.U.D.  Other Topics Concern  . Not on file  Social History Narrative  . Not on file   Social Determinants of Health   Financial Resource Strain:   . Difficulty of Paying Living Expenses: Not on file  Food Insecurity:   . Worried About Programme researcher, broadcasting/film/video in the Last Year: Not on file  . Ran Out of Food in the Last Year: Not on file  Transportation Needs:   . Lack of Transportation (Medical): Not on file  . Lack of Transportation (Non-Medical): Not on file  Physical Activity:   . Days of Exercise per Week: Not on file  . Minutes of Exercise per Session: Not on file  Stress:   . Feeling of  Stress : Not on file  Social Connections:   . Frequency of Communication with Friends and Family: Not on file  . Frequency of Social Gatherings with Friends and Family: Not on file  . Attends Religious Services: Not on file  . Active Member of Clubs or Organizations: Not on file  . Attends Archivist Meetings: Not on file  . Marital Status: Not on file  Intimate Partner Violence:   . Fear of Current or Ex-Partner: Not on file  . Emotionally Abused: Not on file  . Physically Abused: Not on file  . Sexually Abused: Not on file    Allergies:  Allergies  Allergen Reactions  . Amoxicillin Hives  . Zithromax [Azithromycin Dihydrate]  Hives  . Azithromycin Rash    Medications: Prior to Admission medications   Medication Sig Start Date End Date Taking? Authorizing Provider  albuterol (VENTOLIN HFA) 108 (90 Base) MCG/ACT inhaler Inhale 2 puffs into the lungs every 6 (six) hours as needed for wheezing or shortness of breath. Patient not taking: Reported on 04/04/2019 12/15/18   Benjamine Mola, FNP  DULoxetine (CYMBALTA) 30 MG capsule Take 1 capsule (30 mg total) by mouth daily. 03/24/19   Jodelle Green, FNP  hydrOXYzine (ATARAX/VISTARIL) 10 MG tablet Take 1 tablet (10 mg total) by mouth 3 (three) times daily as needed for anxiety. Patient not taking: Reported on 08/07/2019 03/24/19   Jodelle Green, FNP  levonorgestrel (MIRENA) 20 MCG/24HR IUD 1 each by Intrauterine route once.    [provider]  norgestimate-ethinyl estradiol (ORTHO-CYCLEN) 0.25-35 MG-MCG tablet Take 1 tablet by mouth daily. 08/23/19   Malachy Mood, MD    Physical Exam Vitals: There were no vitals filed for this visit. No LMP recorded. (Menstrual status: IUD).   No physical exam as this was a remote telephone visit to promote social distancing during the current COVID-19 Pandemic   Assessment: 34 y.o. G1P1001 follow up anxieyt and depression  Plan: Problem List Items Addressed This Visit    None    Visit Diagnoses    Ectopic pregnancy, unspecified location, unspecified whether intrauterine pregnancy present    -  Primary   Initiation of oral contraception          1) Ectopic pregnancy - HCG trended down to 85mIU/mL will discontinue follow up HCG's  2) Conraception - still contemplating tubal vs vasectomy.  Rx for Sprintec  for now  2) Telephone Time 9:39 minutes   Malachy Mood, MD, [place], [place]

## 2019-10-02 ENCOUNTER — Telehealth: Payer: Self-pay | Admitting: Nurse Practitioner

## 2019-10-02 DIAGNOSIS — F329 Major depressive disorder, single episode, unspecified: Secondary | ICD-10-CM

## 2019-10-02 DIAGNOSIS — F32A Depression, unspecified: Secondary | ICD-10-CM

## 2019-10-02 MED ORDER — DULOXETINE HCL 30 MG PO CPEP: 30.0000 mg | ORAL_CAPSULE | Freq: Every day | ORAL | 1 refills | Status: DC

## 2019-10-02 NOTE — Telephone Encounter (Signed)
Pt needs a refill on DULoxetine (CYMBALTA) 30 MG capsule sent to [facility] in [place]. TOC appt with Fransico Setters 10/20/19

## 2019-10-02 NOTE — Telephone Encounter (Signed)
Sent to pharmacy 

## 2019-10-20 ENCOUNTER — Encounter: Payer: Self-pay | Admitting: Nurse Practitioner

## 2019-10-20 ENCOUNTER — Other Ambulatory Visit: Payer: Self-pay

## 2019-10-20 ENCOUNTER — Ambulatory Visit: Payer: BLUE CROSS/BLUE SHIELD | Admitting: Nurse Practitioner

## 2019-10-20 VITALS — BP 126/84 | HR 76 | Temp 98.2°F | Ht 64.0 in | Wt 153.4 lb

## 2019-10-20 DIAGNOSIS — Z0000 Encounter for general adult medical examination without abnormal findings: Secondary | ICD-10-CM | POA: Diagnosis not present

## 2019-10-20 DIAGNOSIS — F32A Depression, unspecified: Secondary | ICD-10-CM

## 2019-10-20 DIAGNOSIS — E559 Vitamin D deficiency, unspecified: Secondary | ICD-10-CM

## 2019-10-20 DIAGNOSIS — F329 Major depressive disorder, single episode, unspecified: Secondary | ICD-10-CM

## 2019-10-20 LAB — CBC
HCT: 39.1 % (ref 36.0–46.0)
Hemoglobin: 13.1 g/dL (ref 12.0–15.0)
MCHC: 33.5 g/dL (ref 30.0–36.0)
MCV: 89.6 fl (ref 78.0–100.0)
Platelets: 317 10*3/uL (ref 150.0–400.0)
RBC: 4.36 Mil/uL (ref 3.87–5.11)
RDW: 13 % (ref 11.5–15.5)
WBC: 6.3 10*3/uL (ref 4.0–10.5)

## 2019-10-20 LAB — LIPID PANEL
Cholesterol: 198 mg/dL (ref 0–200)
HDL: 50.6 mg/dL (ref >39.00)
LDL Cholesterol: 129 mg/dL — ABNORMAL HIGH (ref 0–99)
NonHDL: 147.8
Total CHOL/HDL Ratio: 4
Triglycerides: 94 mg/dL (ref 0.0–149.0)
VLDL: 18.8 mg/dL (ref 0.0–40.0)

## 2019-10-20 LAB — COMPREHENSIVE METABOLIC PANEL
ALT: 8 U/L (ref 0–35)
AST: 11 U/L (ref 0–37)
Albumin: 3.9 g/dL (ref 3.5–5.2)
Alkaline Phosphatase: 46 U/L (ref 39–117)
BUN: 12 mg/dL (ref 6–23)
CO2: 26 mEq/L (ref 19–32)
Calcium: 9.1 mg/dL (ref 8.4–10.5)
Chloride: 104 mEq/L (ref 96–112)
Creatinine, Ser: 0.87 mg/dL (ref 0.40–1.20)
GFR: 73.64 mL/min (ref >60.00)
Glucose, Bld: 83 mg/dL (ref 70–99)
Potassium: 4.3 mEq/L (ref 3.5–5.1)
Sodium: 136 mEq/L (ref 135–145)
Total Bilirubin: 0.3 mg/dL (ref 0.2–1.2)
Total Protein: 6.1 g/dL (ref 6.0–8.3)

## 2019-10-20 LAB — TSH: TSH: 0.77 u[IU]/mL (ref 0.35–4.50)

## 2019-10-20 LAB — VITAMIN D 25 HYDROXY (VIT D DEFICIENCY, FRACTURES): VITD: 80.98 ng/mL (ref 30.00–100.00)

## 2019-10-20 NOTE — Patient Instructions (Signed)
It was very  nice to meet you today.  Continue on the Cymbalta at the same dose you seem to be doing very well with it.  Please go to the lab today for routine preventative healthcare labs.  Continue to follow with your gynecologist for Pap test as recommended.  Follow-up office visit annually, or sooner if you have any problems or concerns.

## 2019-10-20 NOTE — Progress Notes (Signed)
Pre visit review using our clinic review tool, if applicable. No additional management support is needed unless otherwise documented below in the visit note. 

## 2019-10-20 NOTE — Progress Notes (Signed)
New Patient Office Visit  Subjective:  Patient ID: Kendra Freeman, female    DOB: [birth year]  Age: 34 y.o. MRN: [id number]  CC:  Chief Complaint  Patient presents with  . Transitions Of Care    HPI Kendra Freeman presents for est care. She presents on Cymbalta that she has been taking since college. She saw a psychiatrist then who prescribed it. She has also taken Concerta and she stopped that when she was pregnant.  She has been on stable dose of Cymbalta  and is not able to wean off of it without sad/depression symptoms recurring. She has not needed to take Atarax. 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 She feels well. She lost  40 lbs  Jan 2020 with weight watcher's . She is still working on her diet.  Dec 2020 ectopic pregnancy with IUD. Now on BCP and GYN at [facility].  Low vit D 1000 iu daily since 2018 and takes OTC supplements. She comes in today without any depression/anxiety or health concerns.  Past Medical History:  Diagnosis Date  . ADD (attention deficit disorder)   . Attention deficit disorder   . Major depression   . PMS (premenstrual syndrome)   . Pseudotumor cerebri     Past Surgical History:  Procedure Laterality Date  . ADENOIDECTOMY    . CESAREAN SECTION    . INNER EAR SURGERY    . INTRAUTERINE DEVICE (IUD) INSERTION  08/2018  . TONSILLECTOMY      Family History  Problem Relation Age of Onset  . Hodgkin's lymphoma Maternal Grandmother   . Lymphoma Maternal Grandmother   . Hypertension Paternal Grandfather   . Diabetes Father        Type 2  . Heart disease Maternal Grandfather     Social History   Socioeconomic History  . Marital status: Married    Spouse name: Not on file  . Number of children: Not on file  . Years of education: Not on file  . Highest education level: Not on file  Occupational History  . Not on file  Tobacco Use  . Smoking status: Former Research scientist (life sciences)  . Smokeless tobacco: Never Used  Substance and Sexual Activity  .  Alcohol use: No  . Drug use: No  . Sexual activity: Yes    Birth control/protection: I.U.D.  Other Topics Concern  . Not on file  Social History Narrative  . Not on file   Social Determinants of Health   Financial Resource Strain:   . Difficulty of Paying Living Expenses:   Food Insecurity:   . Worried About Charity fundraiser in the Last Year:   . Arboriculturist in the Last Year:   Transportation Needs:   . Film/video editor (Medical):   Marland Kitchen Lack of Transportation (Non-Medical):   Physical Activity:   . Days of Exercise per Week:   . Minutes of Exercise per Session:   Stress:   . Feeling of Stress :   Social Connections:   . Frequency of Communication with Friends and Family:   . Frequency of Social Gatherings with Friends and Family:   . Attends Religious Services:   . Active Member of Clubs or Organizations:   . Attends Archivist Meetings:   Marland Kitchen Marital Status:   Intimate Partner Violence:   . Fear of Current or Ex-Partner:   . Emotionally Abused:   Marland Kitchen Physically Abused:   . Sexually Abused:  ROS Review of Systems See HPI for positives, otherwise negative.   Objective:   Today's Vitals: BP 126/84   Pulse 76   Temp 98.2 F (36.8 C) (Skin)   Ht 5' 4"  (1.626 m)   Wt 153 lb 6.4 oz (69.6 kg)   SpO2 95%   BMI 26.33 kg/m   Physical Exam Vitals reviewed.  Constitutional:      Appearance: Normal appearance.  HENT:     Head: Normocephalic.  Cardiovascular:     Rate and Rhythm: Normal rate and regular rhythm.     Pulses: Normal pulses.     Heart sounds: Normal heart sounds.  Pulmonary:     Breath sounds: Normal breath sounds.  Abdominal:     Palpations: Abdomen is soft.     Tenderness: There is no abdominal tenderness.  Musculoskeletal:        General: Normal range of motion.  Skin:    General: Skin is warm and dry.  Neurological:     General: No focal deficit present.     Mental Status: She is alert and oriented to person, place, and  time.  Psychiatric:        Mood and Affect: Mood normal.        Behavior: Behavior normal.        Thought Content: Thought content normal.        Judgment: Judgment normal.      Assessment & Plan:   Problem List Items Addressed This Visit      Other   Vitamin D deficiency   Relevant Orders   Vitamin D (25 hydroxy) (Completed)   Depressive disorder - Primary   Relevant Orders   TSH (Completed)    Other Visit Diagnoses    Preventative health care       Relevant Orders   CBC (Completed)   Comp Met (CMET) (Completed)   Lipid Profile (Completed)      Outpatient Encounter Medications as of 10/20/2019  Medication Sig  . DULoxetine (CYMBALTA) 30 MG capsule Take 1 capsule (30 mg total) by mouth daily.  . hydrOXYzine (ATARAX/VISTARIL) 10 MG tablet Take 1 tablet (10 mg total) by mouth 3 (three) times daily as needed for anxiety.  . norgestimate-ethinyl estradiol (ORTHO-CYCLEN) 0.25-35 MG-MCG tablet Take 1 tablet by mouth daily.  . [DISCONTINUED] albuterol (VENTOLIN HFA) 108 (90 Base) MCG/ACT inhaler Inhale 2 puffs into the lungs every 6 (six) hours as needed for wheezing or shortness of breath.  . [DISCONTINUED] levonorgestrel (MIRENA) 20 MCG/24HR IUD 1 each by Intrauterine route once.   No facility-administered encounter medications on file as of 10/20/2019.   Continue on the Cymbalta at the same dose you seem to be doing very well with it.  Please go to the lab today for routine preventative healthcare labs.  Continue to follow with your gynecologist for Pap test as recommended.  Follow-up office visit annually, or sooner if you have any problems or concerns.  Follow-up: Return in about 1 year (around 10/19/2020).   This visit occurred during the SARS-CoV-2 public health emergency.  Safety protocols were in place, including screening questions prior to the visit, additional usage of staff PPE, and extensive cleaning of exam room while observing appropriate contact time as  indicated for disinfecting solutions.    Denice Paradise, NP

## 2019-10-22 ENCOUNTER — Encounter: Payer: Self-pay | Admitting: Nurse Practitioner

## 2019-11-16 DIAGNOSIS — H5213 Myopia, bilateral: Secondary | ICD-10-CM | POA: Diagnosis not present

## 2020-01-10 DIAGNOSIS — Z20822 Contact with and (suspected) exposure to covid-19: Secondary | ICD-10-CM | POA: Diagnosis not present

## 2020-01-10 DIAGNOSIS — Z03818 Encounter for observation for suspected exposure to other biological agents ruled out: Secondary | ICD-10-CM | POA: Diagnosis not present

## 2020-02-02 ENCOUNTER — Telehealth: Payer: Self-pay | Admitting: Emergency Medicine

## 2020-02-02 DIAGNOSIS — J0191 Acute recurrent sinusitis, unspecified: Secondary | ICD-10-CM

## 2020-02-02 DIAGNOSIS — Z20822 Contact with and (suspected) exposure to covid-19: Secondary | ICD-10-CM | POA: Diagnosis not present

## 2020-02-02 DIAGNOSIS — J019 Acute sinusitis, unspecified: Secondary | ICD-10-CM | POA: Diagnosis not present

## 2020-02-02 DIAGNOSIS — B9689 Other specified bacterial agents as the cause of diseases classified elsewhere: Secondary | ICD-10-CM | POA: Diagnosis not present

## 2020-02-02 DIAGNOSIS — Z03818 Encounter for observation for suspected exposure to other biological agents ruled out: Secondary | ICD-10-CM | POA: Diagnosis not present

## 2020-02-02 DIAGNOSIS — R05 Cough: Secondary | ICD-10-CM | POA: Diagnosis not present

## 2020-02-02 NOTE — Progress Notes (Signed)
We are sorry that you are not feeling well.  Here is how we plan to help!  Based on what you have shared with me it looks like you have sinusitis.  Sinusitis is inflammation and infection in the sinus cavities of the head.  Based on your presentation I believe you most likely have Acute Viral Sinusitis.This is an infection most likely caused by a virus. There is not specific treatment for viral sinusitis other than to help you with the symptoms until the infection runs its course.  You may use an oral decongestant such as Mucinex D or if you have glaucoma or high blood pressure use plain Mucinex. Saline nasal spray help and can safely be used as often as needed for congestion,   According to the current IDSA guidelines, you do not meet criteria for bacterial infection requiring antibiotic therapy as treatment. Those guidelines are as follows:  1) Persistent symptoms or signs of acute rhinosinusitis lasting 10 days or more without clinical improvement  2) Severe symptoms or signs of high fever (102F [39C] or higher) and purulent nasal discharge or facial pain for three days or more at the beginning of illness  3) Worsening symptoms or signs (i.e., fever, headache, or increase in nasal discharge) that lasted five to six days, got better, and then worsened (double-sickening)     Some authorities believe that zinc sprays or the use of Echinacea may shorten the course of your symptoms.  Sinus infections are not as easily transmitted as other respiratory infection, however we still recommend that you avoid close contact with loved ones, especially the very young and elderly.  Remember to wash your hands thoroughly throughout the day as this is the number one way to prevent the spread of infection!  Home Care:  Only take medications as instructed by your medical team.  Do not take these medications with alcohol.  A steam or ultrasonic humidifier can help congestion.  You can place a towel over  your head and breathe in the steam from hot water coming from a faucet.  Avoid close contacts especially the very young and the elderly.  Cover your mouth when you cough or sneeze.  Always remember to wash your hands.  Get Help Right Away If:  You develop worsening fever or sinus pain.  You develop a severe head ache or visual changes.  Your symptoms persist after you have completed your treatment plan.  Make sure you  Understand these instructions.  Will watch your condition.  Will get help right away if you are not doing well or get worse.  Your e-visit answers were reviewed by a board certified advanced clinical practitioner to complete your personal care plan.  Depending on the condition, your plan could have included both over the counter or prescription medications.  If there is a problem please reply  once you have received a response from your provider.  Your safety is important to [place].  If you have drug allergies check your prescription carefully.    You can use MyChart to ask questions about today's visit, request a non-urgent call back, or ask for a work or school excuse for 24 hours related to this e-Visit. If it has been greater than 24 hours you will need to follow up with your provider, or enter a new e-Visit to address those concerns.  You will get an e-mail in the next two days asking about your experience.  I hope that your e-visit has been valuable and will  speed your recovery. Thank you for using e-visits.   .**Please do not respond to this message unless you have follow up questions.** Greater than 5 but less than 10 minutes spent researching, coordinating, and implementing care for this patient today

## 2020-03-27 ENCOUNTER — Other Ambulatory Visit: Payer: Self-pay | Admitting: Family Medicine

## 2020-03-27 DIAGNOSIS — F329 Major depressive disorder, single episode, unspecified: Secondary | ICD-10-CM

## 2020-03-27 DIAGNOSIS — F32A Depression, unspecified: Secondary | ICD-10-CM

## 2020-04-30 ENCOUNTER — Other Ambulatory Visit: Payer: Self-pay | Admitting: Obstetrics and Gynecology

## 2020-04-30 ENCOUNTER — Other Ambulatory Visit: Payer: Self-pay

## 2020-04-30 MED ORDER — NORGESTIMATE-ETH ESTRADIOL 0.25-35 MG-MCG PO TABS: 1.0000 | ORAL_TABLET | Freq: Every day | ORAL | 0 refills | Status: DC

## 2020-04-30 NOTE — Telephone Encounter (Signed)
Patient is scheduled for 06/04/20 with AMS for annual

## 2020-06-04 ENCOUNTER — Encounter: Payer: Self-pay | Admitting: Obstetrics and Gynecology

## 2020-06-04 ENCOUNTER — Other Ambulatory Visit: Payer: Self-pay

## 2020-06-04 ENCOUNTER — Ambulatory Visit (INDEPENDENT_AMBULATORY_CARE_PROVIDER_SITE_OTHER): Payer: BC Managed Care – PPO | Admitting: Obstetrics and Gynecology

## 2020-06-04 VITALS — BP 140/90 | Ht 64.0 in | Wt 166.0 lb

## 2020-06-04 DIAGNOSIS — Z1239 Encounter for other screening for malignant neoplasm of breast: Secondary | ICD-10-CM

## 2020-06-04 DIAGNOSIS — Z3041 Encounter for surveillance of contraceptive pills: Secondary | ICD-10-CM

## 2020-06-04 DIAGNOSIS — Z01419 Encounter for gynecological examination (general) (routine) without abnormal findings: Secondary | ICD-10-CM | POA: Diagnosis not present

## 2020-06-04 MED ORDER — NORGESTIMATE-ETH ESTRADIOL 0.25-35 MG-MCG PO TABS: 1.0000 | ORAL_TABLET | Freq: Every day | ORAL | 3 refills | Status: DC

## 2020-06-04 NOTE — Progress Notes (Signed)
Gynecology Annual Exam   PCP: Theadore Nan, NP  Chief Complaint:  Chief Complaint  Patient presents with  . Gynecologic Exam    History of Present Illness: Patient is a 34 y.o. G1P1001 presents for annual exam. The patient has no complaints today.   LMP: Patient's last menstrual period was 05/27/2020. Average Interval: regular monthly Duration of flow: 5 days Heavy Menses: no Clots: no Intermenstrual Bleeding: no Postcoital Bleeding: no Dysmenorrhea: no  The patient is sexually active. She currently uses OCP (estrogen/progesterone) for contraception. She denies dyspareunia.  The patient does perform self breast exams.  There is no notable family history of breast or ovarian cancer in her family.  The patient wears seatbelts: yes.   The patient has regular exercise: not asked.    The patient denies current symptoms of depression.    Review of Systems: Review of Systems  Constitutional: Negative for chills and fever.  HENT: Negative for congestion.   Respiratory: Negative for cough and shortness of breath.   Cardiovascular: Negative for chest pain and palpitations.  Gastrointestinal: Negative for abdominal pain, constipation, diarrhea, heartburn, nausea and vomiting.  Genitourinary: Negative for dysuria, frequency and urgency.  Skin: Negative for itching and rash.  Neurological: Negative for dizziness and headaches.  Endo/Heme/Allergies: Negative for polydipsia.  Psychiatric/Behavioral: Negative for depression.    Past Medical History:  Patient Active Problem List   Diagnosis Date Noted  . Depressive disorder 03/11/2018  . Anxiety 03/11/2018  . Vulvar lesion 01/20/2018  . Skin tag 01/20/2018  . Vitamin D deficiency 01/06/2018  . Skin tag of female perineum 01/06/2018  . Fatigue 01/06/2018    Past Surgical History:  Past Surgical History:  Procedure Laterality Date  . ADENOIDECTOMY    . CESAREAN SECTION    . INNER EAR SURGERY    . INTRAUTERINE DEVICE  (IUD) INSERTION  08/2018  . TONSILLECTOMY      Gynecologic History:  Patient's last menstrual period was 05/27/2020. Contraception: OCP (estrogen/progesterone) Last Pap: Results were:08/25/2018 NIL and HR HPV negative   Obstetric History: G1P1001  Family History:  Family History  Problem Relation Age of Onset  . Hodgkin's lymphoma Maternal Grandmother   . Lymphoma Maternal Grandmother   . Hypertension Paternal Grandfather   . Diabetes Father        Type 2  . Heart disease Maternal Grandfather     Social History:  Social History   Socioeconomic History  . Marital status: Married    Spouse name: Not on file  . Number of children: Not on file  . Years of education: Not on file  . Highest education level: Not on file  Occupational History  . Not on file  Tobacco Use  . Smoking status: Former Games developer  . Smokeless tobacco: Never Used  Vaping Use  . Vaping Use: Never used  Substance and Sexual Activity  . Alcohol use: No  . Drug use: No  . Sexual activity: Yes    Birth control/protection: Pill, None  Other Topics Concern  . Not on file  Social History Narrative  . Not on file   Social Determinants of Health   Financial Resource Strain:   . Difficulty of Paying Living Expenses: Not on file  Food Insecurity:   . Worried About Programme researcher, broadcasting/film/video in the Last Year: Not on file  . Ran Out of Food in the Last Year: Not on file  Transportation Needs:   . Lack of Transportation (Medical): Not on  file  . Lack of Transportation (Non-Medical): Not on file  Physical Activity:   . Days of Exercise per Week: Not on file  . Minutes of Exercise per Session: Not on file  Stress:   . Feeling of Stress : Not on file  Social Connections:   . Frequency of Communication with Friends and Family: Not on file  . Frequency of Social Gatherings with Friends and Family: Not on file  . Attends Religious Services: Not on file  . Active Member of Clubs or Organizations: Not on file  .  Attends Banker Meetings: Not on file  . Marital Status: Not on file  Intimate Partner Violence:   . Fear of Current or Ex-Partner: Not on file  . Emotionally Abused: Not on file  . Physically Abused: Not on file  . Sexually Abused: Not on file    Allergies:  Allergies  Allergen Reactions  . Amoxicillin Hives  . Zithromax [Azithromycin Dihydrate] Hives  . Azithromycin Rash    Medications: Prior to Admission medications   Medication Sig Start Date End Date Taking? Authorizing Provider  DULoxetine (CYMBALTA) 30 MG capsule TAKE 1 CAPSULE(30 MG) BY MOUTH DAILY 03/27/20  Yes Glori Luis, MD  hydrOXYzine (ATARAX/VISTARIL) 10 MG tablet Take 1 tablet (10 mg total) by mouth 3 (three) times daily as needed for anxiety. 03/24/19  Yes Guse, Janna Arch, FNP  norgestimate-ethinyl estradiol (ORTHO-CYCLEN) 0.25-35 MG-MCG tablet Take 1 tablet by mouth daily. 04/30/20  Yes Vena Austria, MD    Physical Exam Vitals: Blood pressure 140/90, height 5\' 4"  (1.626 m), weight 166 lb (75.3 kg), last menstrual period 05/27/2020.  General: NAD HEENT: normocephalic, anicteric Thyroid: no enlargement, no palpable nodules Pulmonary: No increased work of breathing, CTAB Cardiovascular: RRR, distal pulses 2+ Breast: Breast symmetrical, no tenderness, no palpable nodules or masses, no skin or nipple retraction present, no nipple discharge.  No axillary or supraclavicular lymphadenopathy. Abdomen: NABS, soft, non-tender, non-distended.  Umbilicus without lesions.  No hepatomegaly, splenomegaly or masses palpable. No evidence of hernia  Genitourinary:  External: Normal external female genitalia.  Normal urethral meatus, normal Bartholin's and Skene's glands.    Vagina: Normal vaginal mucosa, no evidence of prolapse.    Cervix: Grossly normal in appearance, no bleeding  Uterus: Non-enlarged, mobile, normal contour.  No CMT  Adnexa: ovaries non-enlarged, no adnexal masses  Rectal:  deferred  Lymphatic: no evidence of inguinal lymphadenopathy Extremities: no edema, erythema, or tenderness Neurologic: Grossly intact Psychiatric: mood appropriate, affect full  Female chaperone present for pelvic and breast  portions of the physical exam    Assessment: 34 y.o. G1P1001 routine annual exam  Plan: Problem List Items Addressed This Visit    None    Visit Diagnoses    Encounter for gynecological examination without abnormal finding    -  Primary   Breast screening       Surveillance for birth control, oral contraceptives          1) STI screening  was notoffered and therefore not obtained  2)  ASCCP guidelines and rational discussed.  Patient opts for every 3 years screening interval  3) Contraception - the patient is currently using  OCP (estrogen/progesterone).  She is happy with her current form of contraception and plans to continue - had previously discussed interest in BTL or vasectomy   4) Routine healthcare maintenance including cholesterol, diabetes screening discussed managed by PCP  5) Return in about 1 year (around 06/04/2021) for annual.  Vena Austria, MD, [facility], [facility] 06/04/2020, 3:01 PM

## 2020-09-11 NOTE — Telephone Encounter (Signed)
Mirena rcvd/charged 07/27/18

## 2020-10-08 ENCOUNTER — Other Ambulatory Visit: Payer: Self-pay | Admitting: Family Medicine

## 2020-10-08 DIAGNOSIS — F329 Major depressive disorder, single episode, unspecified: Secondary | ICD-10-CM

## 2020-10-08 DIAGNOSIS — F32A Depression, unspecified: Secondary | ICD-10-CM

## 2021-03-12 DIAGNOSIS — H5213 Myopia, bilateral: Secondary | ICD-10-CM | POA: Diagnosis not present

## 2021-04-07 ENCOUNTER — Other Ambulatory Visit: Payer: Self-pay | Admitting: Internal Medicine

## 2021-04-07 DIAGNOSIS — F329 Major depressive disorder, single episode, unspecified: Secondary | ICD-10-CM

## 2021-04-07 DIAGNOSIS — F32A Depression, unspecified: Secondary | ICD-10-CM

## 2021-04-09 ENCOUNTER — Other Ambulatory Visit: Payer: Self-pay | Admitting: Nurse Practitioner

## 2021-04-09 DIAGNOSIS — F329 Major depressive disorder, single episode, unspecified: Secondary | ICD-10-CM

## 2021-04-09 DIAGNOSIS — F32A Depression, unspecified: Secondary | ICD-10-CM

## 2021-04-09 MED ORDER — DULOXETINE HCL 30 MG PO CPEP: ORAL_CAPSULE | ORAL | 1 refills | Status: DC

## 2021-04-09 NOTE — Telephone Encounter (Signed)
Patient formerly Kendra Freeman patient  has scheduled TOC with [facility] for 05/22/21 Needs refill on Cymbalta okay to fill for until appointment? I have pended 30 day supply with one refill to last til appointment.

## 2021-04-09 NOTE — Telephone Encounter (Signed)
Patient called in stating her pharmacy denied a request on a refill for her DULoxetine (CYMBALTA) 30 MG capsule.She is unsure if she needs appointment to get a refill,please advise.

## 2021-04-10 ENCOUNTER — Encounter: Payer: Self-pay | Admitting: *Deleted

## 2021-05-22 ENCOUNTER — Ambulatory Visit: Payer: BC Managed Care – PPO | Admitting: Adult Health

## 2021-07-09 ENCOUNTER — Other Ambulatory Visit: Payer: Self-pay

## 2021-07-09 MED ORDER — NORGESTIMATE-ETH ESTRADIOL 0.25-35 MG-MCG PO TABS: 1.0000 | ORAL_TABLET | Freq: Every day | ORAL | 3 refills | Status: DC

## 2021-07-10 ENCOUNTER — Telehealth: Payer: Self-pay

## 2021-07-10 NOTE — Telephone Encounter (Signed)
Pt called triage 11/30 for refill on OCP's. Refilled for pt. She has her annual scheduled.

## 2021-07-30 ENCOUNTER — Ambulatory Visit (INDEPENDENT_AMBULATORY_CARE_PROVIDER_SITE_OTHER): Payer: BC Managed Care – PPO | Admitting: Obstetrics and Gynecology

## 2021-07-30 ENCOUNTER — Encounter: Payer: Self-pay | Admitting: Obstetrics and Gynecology

## 2021-07-30 ENCOUNTER — Other Ambulatory Visit (HOSPITAL_COMMUNITY)
Admission: RE | Admit: 2021-07-30 | Discharge: 2021-07-30 | Disposition: A | Discharge status: Home / Self Care | Payer: BC Managed Care – PPO | Source: Ambulatory Visit | Attending: Obstetrics and Gynecology | Admitting: Obstetrics and Gynecology

## 2021-07-30 ENCOUNTER — Other Ambulatory Visit: Payer: Self-pay

## 2021-07-30 VITALS — BP 150/95 | HR 85 | Ht 64.0 in | Wt 180.0 lb

## 2021-07-30 DIAGNOSIS — Z1239 Encounter for other screening for malignant neoplasm of breast: Secondary | ICD-10-CM | POA: Diagnosis not present

## 2021-07-30 DIAGNOSIS — Z124 Encounter for screening for malignant neoplasm of cervix: Secondary | ICD-10-CM

## 2021-07-30 DIAGNOSIS — F32A Depression, unspecified: Secondary | ICD-10-CM

## 2021-07-30 DIAGNOSIS — Z01419 Encounter for gynecological examination (general) (routine) without abnormal findings: Secondary | ICD-10-CM | POA: Diagnosis not present

## 2021-07-30 DIAGNOSIS — R030 Elevated blood-pressure reading, without diagnosis of hypertension: Secondary | ICD-10-CM | POA: Diagnosis not present

## 2021-07-30 DIAGNOSIS — F419 Anxiety disorder, unspecified: Secondary | ICD-10-CM

## 2021-07-30 MED ORDER — SLYND 4 MG PO TABS: 1.0000 | ORAL_TABLET | Freq: Every day | ORAL | 11 refills | Status: DC

## 2021-07-30 MED ORDER — VENLAFAXINE HCL ER 75 MG PO CP24: 75.0000 mg | ORAL_CAPSULE | Freq: Every day | ORAL | 3 refills | Status: DC

## 2021-07-30 NOTE — Progress Notes (Deleted)
Gynecology Annual Exam   PCP: Theadore Nan, NP  Chief Complaint:  Chief Complaint  Patient presents with   Gynecologic Exam    Annual - wants refill on Cymbalta. RM 3    History of Present Illness: Patient is a 35 y.o. G1P1001 presents for annual exam. The patient has no complaints today.   LMP: Patient's last menstrual period was 07/06/2021 (approximate). Average Interval: regular, 28 days Duration of flow: 5 days Heavy Menses: no Clots: no Intermenstrual Bleeding: no Postcoital Bleeding: no Dysmenorrhea: no  The patient is sexually active. She currently uses OCP (estrogen/progesterone) for contraception. She denies dyspareunia.  The patient does perform self breast exams.  There is no notable family history of breast or ovarian cancer in her family.  The patient wears seatbelts: yes.   The patient has regular exercise: not asked.    The patient reports current symptoms of depression. Was well controlled on Cymbalta for several years, previously tried and failed Wellbutrin.  Feels Cymbatla no longer working as well.    Review of Systems: Review of Systems  Constitutional:  Negative for chills and fever.  HENT:  Negative for congestion.   Respiratory:  Negative for cough and shortness of breath.   Cardiovascular:  Negative for chest pain and palpitations.  Gastrointestinal:  Negative for abdominal pain, constipation, diarrhea, heartburn, nausea and vomiting.  Genitourinary:  Negative for dysuria, frequency and urgency.  Skin:  Negative for itching and rash.  Neurological:  Negative for dizziness and headaches.  Endo/Heme/Allergies:  Negative for polydipsia.  Psychiatric/Behavioral:  Positive for depression. Negative for hallucinations, memory loss, substance abuse and suicidal ideas. The patient is nervous/anxious. The patient does not have insomnia.    Past Medical History:  Patient Active Problem List   Diagnosis Date Noted   Depressive disorder 03/11/2018    Anxiety 03/11/2018   Vulvar lesion 01/20/2018   Skin tag 01/20/2018   Vitamin D deficiency 01/06/2018   Skin tag of female perineum 01/06/2018   Fatigue 01/06/2018    Past Surgical History:  Past Surgical History:  Procedure Laterality Date   ADENOIDECTOMY     CESAREAN SECTION     INNER EAR SURGERY     INTRAUTERINE DEVICE (IUD) INSERTION  08/2018   TONSILLECTOMY      Gynecologic History:  Patient's last menstrual period was 07/06/2021 (approximate). Contraception: OCP (estrogen/progesterone) Last Pap: Results were: 1/6/2020NIL and HR HPV negative   Obstetric History: G1P1001  Family History:  Family History  Problem Relation Age of Onset   Hodgkin's lymphoma Maternal Grandmother    Lymphoma Maternal Grandmother    Hypertension Paternal Grandfather    Diabetes Father        Type 2   Heart disease Maternal Grandfather     Social History:  Social History   Socioeconomic History   Marital status: Married    Spouse name: Not on file   Number of children: Not on file   Years of education: Not on file   Highest education level: Not on file  Occupational History   Not on file  Tobacco Use   Smoking status: Former   Smokeless tobacco: Never  Vaping Use   Vaping Use: Never used  Substance and Sexual Activity   Alcohol use: No   Drug use: No   Sexual activity: Yes    Birth control/protection: Pill  Other Topics Concern   Not on file  Social History Narrative   Not on file   Social Determinants  of Health   Financial Resource Strain: Not on file  Food Insecurity: Not on file  Transportation Needs: Not on file  Physical Activity: Not on file  Stress: Not on file  Social Connections: Not on file  Intimate Partner Violence: Not on file    Allergies:  Allergies  Allergen Reactions   Amoxicillin Hives   Zithromax [Azithromycin Dihydrate] Hives   Azithromycin Rash    Medications: Prior to Admission medications   Medication Sig Start Date End Date  Taking? Authorizing Provider  Drospirenone (SLYND) 4 MG TABS Take 1 tablet by mouth daily. 07/30/21  Yes Vena Austria, MD  hydrOXYzine (ATARAX/VISTARIL) 10 MG tablet Take 1 tablet (10 mg total) by mouth 3 (three) times daily as needed for anxiety. 03/24/19  Yes Guse, Janna Arch, FNP  norgestimate-ethinyl estradiol (ORTHO-CYCLEN) 0.25-35 MG-MCG tablet Take 1 tablet by mouth daily. 07/09/21  Yes Vena Austria, MD  venlafaxine XR (EFFEXOR-XR) 75 MG 24 hr capsule Take 1 capsule (75 mg total) by mouth daily. 07/30/21  Yes Vena Austria, MD    Physical Exam Vitals: Blood pressure (!) 150/95, pulse 85, height 5\' 4"  (1.626 m), weight 180 lb (81.6 kg), last menstrual period 07/06/2021.  General: NAD HEENT: normocephalic, anicteric Thyroid: no enlargement, no palpable nodules Pulmonary: No increased work of breathing, CTAB Cardiovascular: RRR, distal pulses 2+ Breast: Breast symmetrical, no tenderness, no palpable nodules or masses, no skin or nipple retraction present, no nipple discharge.  No axillary or supraclavicular lymphadenopathy. Abdomen: NABS, soft, non-tender, non-distended.  Umbilicus without lesions.  No hepatomegaly, splenomegaly or masses palpable. No evidence of hernia  Genitourinary:  External: Normal external female genitalia.  Normal urethral meatus, normal Bartholin's and Skene's glands.    Vagina: Normal vaginal mucosa, no evidence of prolapse.    Cervix: Grossly normal in appearance, no bleeding  Uterus: Non-enlarged, mobile, normal contour.  No CMT  Adnexa: ovaries non-enlarged, no adnexal masses  Rectal: deferred  Lymphatic: no evidence of inguinal lymphadenopathy Extremities: no edema, erythema, or tenderness Neurologic: Grossly intact Psychiatric: mood appropriate, affect full  Female chaperone present for pelvic and breast  portions of the physical exam  GAD 7 : Generalized Anxiety Score 07/30/2021 10/20/2019  Nervous, Anxious, on Edge 1 1  Control/stop  worrying 2 1  Worry too much - different things 1 1  Trouble relaxing 1 1  Restless 1 0  Easily annoyed or irritable 2 1  Afraid - awful might happen 1 1  Total GAD 7 Score 9 6  Anxiety Difficulty Somewhat difficult Somewhat difficult     Depression screen [facility] 2/9 07/30/2021 10/20/2019 04/04/2019  Decreased Interest 1 0 0  Down, Depressed, Hopeless 1 1 1   PHQ - 2 Score 2 1 1   Altered sleeping 1 1 0  Tired, decreased energy 1 0 1  Change in appetite 1 0 0  Feeling bad or failure about yourself  1 1 0  Trouble concentrating 1 1 0  Moving slowly or fidgety/restless 0 0 0  Suicidal thoughts 0 0 0  PHQ-9 Score 7 4 2   Difficult doing work/chores Somewhat difficult Somewhat difficult Not difficult at all     Assessment: 35 y.o. G1P1001 routine annual exam  Plan: Problem List Items Addressed This Visit   None Visit Diagnoses     Encounter for gynecological examination without abnormal finding    -  Primary   Screening for malignant neoplasm of cervix       Relevant Orders   Cytology - PAP (Completed)  Breast screening       Elevated BP without diagnosis of hypertension       Anxiety and depression       Relevant Medications   venlafaxine XR (EFFEXOR-XR) 75 MG 24 hr capsule       1) STI screening  was notoffered and therefore not obtained  2)  ASCCP guidelines and rational discussed.  Patient opts for every 3 years screening interval  3) Contraception - the patient is currently using  OCP (estrogen/progesterone).  She is happy with her current form of contraception and plans to continue - BP elevated siwtch to slynd (rx called in and 2 months of samples)  4) Anxiety/Depression - has been on Zoloft, lexapro, and Wellbutrin in past.  Has been on Cymbatla for about 3 year not working as well.  Switch to Effexor XR 75mg   5) BP elevated today - no history of HTN.  Recheck at time of follow up of switch to Effexor.  6) Return in about 6 weeks (around 09/10/2021) for medication  follow and BP check Harris.   11/08/2021, MD, V[facility], [facility] 07/30/2021, 3:02 PM

## 2021-07-30 NOTE — Progress Notes (Incomplete)
Gynecology Annual Exam   PCP: Theadore Nan, NP  Chief Complaint:  Chief Complaint  Patient presents with   Gynecologic Exam    Annual - wants refill on Cymbalta. RM 3    History of Present Illness: Patient is a 35 y.o. G1P1001 presents for annual exam. The patient has no complaints today.   LMP: Patient's last menstrual period was 07/06/2021 (approximate). Average Interval: {Desc; regular/irreg:14544}, {numbers 22-35:14824} days Duration of flow: {numbers; 0-10:33138} days Heavy Menses: {yes/no:63} Clots: {yes/no:63} Intermenstrual Bleeding: {yes/no:63} Postcoital Bleeding: {yes/no:63} Dysmenorrhea: {yes/no:63}  The patient is sexually active. She currently uses OCP (estrogen/progesterone) for contraception. She {has/denies:315300} dyspareunia.  The patient {DOES_DOES ZOX:09604} perform self breast exams.  There {is/is no:19420} notable family history of breast or ovarian cancer in her family.  The patient wears seatbelts: {yes/no:63}.   The patient has regular exercise: {yes/no/not asked:9010}.    The patient {Blank single:19197::"reports","denies"} current symptoms of depression.    Review of Systems: ROS  Past Medical History:  Patient Active Problem List   Diagnosis Date Noted   Depressive disorder 03/11/2018   Anxiety 03/11/2018   Vulvar lesion 01/20/2018   Skin tag 01/20/2018   Vitamin D deficiency 01/06/2018   Skin tag of female perineum 01/06/2018   Fatigue 01/06/2018    Past Surgical History:  Past Surgical History:  Procedure Laterality Date   ADENOIDECTOMY     CESAREAN SECTION     INNER EAR SURGERY     INTRAUTERINE DEVICE (IUD) INSERTION  08/2018   TONSILLECTOMY      Gynecologic History:  Patient's last menstrual period was 07/06/2021 (approximate). Contraception: {method:5051} Last Pap: Results were:08/25/2018 NIL and HR HPV negative   Obstetric History: G1P1001  Family History:  Family History  Problem Relation Age of  Onset   Hodgkin's lymphoma Maternal Grandmother    Lymphoma Maternal Grandmother    Hypertension Paternal Grandfather    Diabetes Father        Type 2   Heart disease Maternal Grandfather     Social History:  Social History   Socioeconomic History   Marital status: Married    Spouse name: Not on file   Number of children: Not on file   Years of education: Not on file   Highest education level: Not on file  Occupational History   Not on file  Tobacco Use   Smoking status: Former   Smokeless tobacco: Never  Vaping Use   Vaping Use: Never used  Substance and Sexual Activity   Alcohol use: No   Drug use: No   Sexual activity: Yes    Birth control/protection: Pill  Other Topics Concern   Not on file  Social History Narrative   Not on file   Social Determinants of Health   Financial Resource Strain: Not on file  Food Insecurity: Not on file  Transportation Needs: Not on file  Physical Activity: Not on file  Stress: Not on file  Social Connections: Not on file  Intimate Partner Violence: Not on file    Allergies:  Allergies  Allergen Reactions   Amoxicillin Hives   Zithromax [Azithromycin Dihydrate] Hives   Azithromycin Rash    Medications: Prior to Admission medications   Medication Sig Start Date End Date Taking? Authorizing Provider  DULoxetine (CYMBALTA) 30 MG capsule TAKE 1 CAPSULE(30 MG) BY MOUTH DAILY 04/09/21  Yes Sherlene Shams, MD  hydrOXYzine (ATARAX/VISTARIL) 10 MG tablet Take 1 tablet (10 mg total) by mouth 3 (three) times daily  as needed for anxiety. 03/24/19  Yes Guse, Janna Arch, FNP  norgestimate-ethinyl estradiol (ORTHO-CYCLEN) 0.25-35 MG-MCG tablet Take 1 tablet by mouth daily. 07/09/21  Yes Vena Austria, MD  DULoxetine (CYMBALTA) 30 MG capsule TAKE 1 CAPSULE(30 MG) BY MOUTH DAILY 04/09/21   Sherlene Shams, MD    Physical Exam Vitals: Blood pressure (!) 150/95, pulse 85, height 5\' 4"  (1.626 m), weight 180 lb (81.6 kg),  last menstrual period 07/06/2021.  General: NAD HEENT: normocephalic, anicteric Thyroid: no enlargement, no palpable nodules Pulmonary: No increased work of breathing, CTAB Cardiovascular: RRR, distal pulses 2+ Breast: Breast symmetrical, no tenderness, no palpable nodules or masses, no skin or nipple retraction present, no nipple discharge.  No axillary or supraclavicular lymphadenopathy. Abdomen: NABS, soft, non-tender, non-distended.  Umbilicus without lesions.  No hepatomegaly, splenomegaly or masses palpable. No evidence of hernia  Genitourinary:  External: Normal external female genitalia.  Normal urethral meatus, normal Bartholin's and Skene's glands.    Vagina: Normal vaginal mucosa, no evidence of prolapse.    Cervix: Grossly normal in appearance, no bleeding  Uterus: Non-enlarged, mobile, normal contour.  No CMT  Adnexa: ovaries non-enlarged, no adnexal masses  Rectal: deferred  Lymphatic: no evidence of inguinal lymphadenopathy Extremities: no edema, erythema, or tenderness Neurologic: Grossly intact Psychiatric: mood appropriate, affect full  Female chaperone present for pelvic and breast  portions of the physical exam    Assessment: 35 y.o. G1P1001 routine annual exam  Plan: Problem List Items Addressed This Visit   None   2) STI screening  {Blank single:19197::"was","was not"}offered and {Blank single:19197::"accepted","declined","therefore not obtained"}  2)  ASCCP guidelines and rational discussed.  Patient opts for {Blank single:19197::"every 5 years","every 3 years","yearly","discontinue age >65","discontinue secondary to prior hysterectomy"} screening interval  3) Contraception - the patient is currently using  {method:5051}.  She is {Blank single:19197::"happy with her current form of contraception and plans to continue","interested in changing to ***","interested in starting Contraception: ***","not currently in need of contraception secondary to being  sterile","attempting to conceive in the near future"}  4) Routine healthcare maintenance including cholesterol, diabetes screening discussed {Blank single:19197::"managed by PCP","Ordered today","To return fasting at a later date","Declines"}  5) No follow-ups on file.   30, MD, [facility], [facility] 07/30/2021, 2:36 PM

## 2021-08-06 LAB — CYTOLOGY - PAP
Adequacy: ABSENT
Comment: NEGATIVE
Diagnosis: NEGATIVE
High risk HPV: NEGATIVE

## 2021-08-07 NOTE — Progress Notes (Signed)
Gynecology Annual Exam   PCP: Theadore Nan, NP  Chief Complaint:  Chief Complaint  Patient presents with   Gynecologic Exam    Annual - wants refill on Cymbalta. RM 3    History of Present Illness: Patient is a 35 y.o. G1P1001 presents for annual exam. The patient has no complaints today.   LMP: Patient's last menstrual period was 07/06/2021 (approximate). Average Interval: regular, 28 days Duration of flow: 5 days Heavy Menses: no Clots: no Intermenstrual Bleeding: no Postcoital Bleeding: no Dysmenorrhea: no  The patient is sexually active. She currently uses OCP (estrogen/progesterone) for contraception. She denies dyspareunia.  The patient does perform self breast exams.  There is no notable family history of breast or ovarian cancer in her family.  The patient wears seatbelts: yes.   The patient has regular exercise: not asked.    The patient reports current symptoms of depression. Was well controlled on Cymbalta for several years, previously tried and failed Wellbutrin.  Feels Cymbatla no longer working as well.    Review of Systems: Review of Systems  Constitutional:  Negative for chills and fever.  HENT:  Negative for congestion.   Respiratory:  Negative for cough and shortness of breath.   Cardiovascular:  Negative for chest pain and palpitations.  Gastrointestinal:  Negative for abdominal pain, constipation, diarrhea, heartburn, nausea and vomiting.  Genitourinary:  Negative for dysuria, frequency and urgency.  Skin:  Negative for itching and rash.  Neurological:  Negative for dizziness and headaches.  Endo/Heme/Allergies:  Negative for polydipsia.  Psychiatric/Behavioral:  Positive for depression. Negative for hallucinations, memory loss, substance abuse and suicidal ideas. The patient is nervous/anxious. The patient does not have insomnia.    Past Medical History:  Patient Active Problem List   Diagnosis Date Noted   Depressive disorder 03/11/2018    Anxiety 03/11/2018   Vulvar lesion 01/20/2018   Skin tag 01/20/2018   Vitamin D deficiency 01/06/2018   Skin tag of female perineum 01/06/2018   Fatigue 01/06/2018    Past Surgical History:  Past Surgical History:  Procedure Laterality Date   ADENOIDECTOMY     CESAREAN SECTION     INNER EAR SURGERY     INTRAUTERINE DEVICE (IUD) INSERTION  08/2018   TONSILLECTOMY      Gynecologic History:  Patient's last menstrual period was 07/06/2021 (approximate). Contraception: OCP (estrogen/progesterone) Last Pap: Results were: 1/6/2020NIL and HR HPV negative   Obstetric History: G1P1001  Family History:  Family History  Problem Relation Age of Onset   Hodgkin's lymphoma Maternal Grandmother    Lymphoma Maternal Grandmother    Hypertension Paternal Grandfather    Diabetes Father        Type 2   Heart disease Maternal Grandfather     Social History:  Social History   Socioeconomic History   Marital status: Married    Spouse name: Not on file   Number of children: Not on file   Years of education: Not on file   Highest education level: Not on file  Occupational History   Not on file  Tobacco Use   Smoking status: Former   Smokeless tobacco: Never  Vaping Use   Vaping Use: Never used  Substance and Sexual Activity   Alcohol use: No   Drug use: No   Sexual activity: Yes    Birth control/protection: Pill  Other Topics Concern   Not on file  Social History Narrative   Not on file   Social Determinants  of Health   Financial Resource Strain: Not on file  Food Insecurity: Not on file  Transportation Needs: Not on file  Physical Activity: Not on file  Stress: Not on file  Social Connections: Not on file  Intimate Partner Violence: Not on file    Allergies:  Allergies  Allergen Reactions   Amoxicillin Hives   Zithromax [Azithromycin Dihydrate] Hives   Azithromycin Rash    Medications: Prior to Admission medications   Medication Sig Start Date End Date  Taking? Authorizing Provider  Drospirenone (SLYND) 4 MG TABS Take 1 tablet by mouth daily. 07/30/21  Yes Vena Austria, MD  hydrOXYzine (ATARAX/VISTARIL) 10 MG tablet Take 1 tablet (10 mg total) by mouth 3 (three) times daily as needed for anxiety. 03/24/19  Yes Guse, Janna Arch, FNP  norgestimate-ethinyl estradiol (ORTHO-CYCLEN) 0.25-35 MG-MCG tablet Take 1 tablet by mouth daily. 07/09/21  Yes Vena Austria, MD  venlafaxine XR (EFFEXOR-XR) 75 MG 24 hr capsule Take 1 capsule (75 mg total) by mouth daily. 07/30/21  Yes Vena Austria, MD    Physical Exam Vitals: Blood pressure (!) 150/95, pulse 85, height 5\' 4"  (1.626 m), weight 180 lb (81.6 kg), last menstrual period 07/06/2021.  General: NAD HEENT: normocephalic, anicteric Thyroid: no enlargement, no palpable nodules Pulmonary: No increased work of breathing, CTAB Cardiovascular: RRR, distal pulses 2+ Breast: Breast symmetrical, no tenderness, no palpable nodules or masses, no skin or nipple retraction present, no nipple discharge.  No axillary or supraclavicular lymphadenopathy. Abdomen: NABS, soft, non-tender, non-distended.  Umbilicus without lesions.  No hepatomegaly, splenomegaly or masses palpable. No evidence of hernia  Genitourinary:  External: Normal external female genitalia.  Normal urethral meatus, normal Bartholin's and Skene's glands.    Vagina: Normal vaginal mucosa, no evidence of prolapse.    Cervix: Grossly normal in appearance, no bleeding  Uterus: Non-enlarged, mobile, normal contour.  No CMT  Adnexa: ovaries non-enlarged, no adnexal masses  Rectal: deferred  Lymphatic: no evidence of inguinal lymphadenopathy Extremities: no edema, erythema, or tenderness Neurologic: Grossly intact Psychiatric: mood appropriate, affect full  Female chaperone present for pelvic and breast  portions of the physical exam  GAD 7 : Generalized Anxiety Score 07/30/2021 10/20/2019  Nervous, Anxious, on Edge 1 1  Control/stop  worrying 2 1  Worry too much - different things 1 1  Trouble relaxing 1 1  Restless 1 0  Easily annoyed or irritable 2 1  Afraid - awful might happen 1 1  Total GAD 7 Score 9 6  Anxiety Difficulty Somewhat difficult Somewhat difficult     Depression screen [facility] 2/9 07/30/2021 10/20/2019 04/04/2019  Decreased Interest 1 0 0  Down, Depressed, Hopeless 1 1 1   PHQ - 2 Score 2 1 1   Altered sleeping 1 1 0  Tired, decreased energy 1 0 1  Change in appetite 1 0 0  Feeling bad or failure about yourself  1 1 0  Trouble concentrating 1 1 0  Moving slowly or fidgety/restless 0 0 0  Suicidal thoughts 0 0 0  PHQ-9 Score 7 4 2   Difficult doing work/chores Somewhat difficult Somewhat difficult Not difficult at all     Assessment: 35 y.o. G1P1001 routine annual exam  Plan: Problem List Items Addressed This Visit   None Visit Diagnoses     Encounter for gynecological examination without abnormal finding    -  Primary   Screening for malignant neoplasm of cervix       Relevant Orders   Cytology - PAP (Completed)  Breast screening       Elevated BP without diagnosis of hypertension       Anxiety and depression       Relevant Medications   venlafaxine XR (EFFEXOR-XR) 75 MG 24 hr capsule       1) STI screening  was notoffered and therefore not obtained  2)  ASCCP guidelines and rational discussed.  Patient opts for every 3 years screening interval  3) Contraception - the patient is currently using  OCP (estrogen/progesterone).  She is happy with her current form of contraception and plans to continue - BP elevated siwtch to slynd (rx called in and 2 months of samples)  4) Anxiety/Depression - has been on Zoloft, lexapro, and Wellbutrin in past.  Has been on Cymbatla for about 3 year not working as well.  Switch to Effexor XR 75mg   5) BP elevated today - no history of HTN.  Recheck at time of follow up of switch to Effexor.  6) Return in about 6 weeks (around 09/10/2021) for medication  follow and BP check Harris.   11/08/2021, MD, [facility], [facility] 08/07/2021, 5:19 PM

## 2021-08-11 ENCOUNTER — Encounter: Payer: Self-pay | Admitting: Obstetrics and Gynecology

## 2021-09-10 ENCOUNTER — Ambulatory Visit: Payer: BC Managed Care – PPO | Admitting: Obstetrics & Gynecology

## 2021-09-12 ENCOUNTER — Ambulatory Visit (INDEPENDENT_AMBULATORY_CARE_PROVIDER_SITE_OTHER): Payer: BC Managed Care – PPO | Admitting: Obstetrics & Gynecology

## 2021-09-12 ENCOUNTER — Encounter: Payer: Self-pay | Admitting: Obstetrics & Gynecology

## 2021-09-12 ENCOUNTER — Other Ambulatory Visit: Payer: Self-pay

## 2021-09-12 VITALS — BP 130/80 | Ht 64.0 in | Wt 180.0 lb

## 2021-09-12 DIAGNOSIS — F32A Depression, unspecified: Secondary | ICD-10-CM

## 2021-09-12 DIAGNOSIS — Z3041 Encounter for surveillance of contraceptive pills: Secondary | ICD-10-CM | POA: Diagnosis not present

## 2021-09-12 DIAGNOSIS — F419 Anxiety disorder, unspecified: Secondary | ICD-10-CM | POA: Diagnosis not present

## 2021-09-12 MED ORDER — VENLAFAXINE HCL ER 75 MG PO CP24: 75.0000 mg | ORAL_CAPSULE | Freq: Every day | ORAL | 3 refills | Status: DC

## 2021-09-12 NOTE — Progress Notes (Signed)
°  History of Present Illness:  Kendra Freeman is a 36 y.o. who was started on Effexor for anxiety and depression, and Slynd for birth control,  approximately 1 month ago. Since that time, she states that her symptoms of depression and anxiety are greatly improved, and she prefers this medicine Effexor to the other she has been on.    She reports she tolerates the Slynd well, until she missed a day and then had BTB for several days.  No headache or other side effects.  She does report that it is too costly at her pharmacy ($100).  Prior OCPs were 0 copays.    She was switched to progesterone only pills due to eleavted blood pressures last visit.  She dose not want the IUD ever again.  PMHx: She  has a past medical history of ADD (attention deficit disorder), Attention deficit disorder, History of ectopic pregnancy (07/2019), Low vitamin D level (2018), Major depression, PMS (premenstrual syndrome), and Pseudotumor cerebri. Also,  has a past surgical history that includes Adenoidectomy; Cesarean section; Inner ear surgery; Tonsillectomy; and Intrauterine device (iud) insertion (08/2018)., family history includes Diabetes in her father; Heart disease in her maternal grandfather; Hodgkin's lymphoma in her maternal grandmother; Hypertension in her paternal grandfather; Lymphoma in her maternal grandmother.,  reports that she has quit smoking. She has never used smokeless tobacco. She reports that she does not drink alcohol and does not use drugs. Current Meds  Medication Sig   Drospirenone (SLYND) 4 MG TABS Take 1 tablet by mouth daily.   [DISCONTINUED] venlafaxine XR (EFFEXOR-XR) 75 MG 24 hr capsule Take 1 capsule (75 mg total) by mouth daily.  . Also, is allergic to amoxicillin, zithromax [azithromycin dihydrate], and azithromycin..  Review of Systems  All other systems reviewed and are negative.  Physical Exam:  BP 130/80    Ht 5\' 4"  (1.626 m)    Wt 180 lb (81.6 kg)    LMP 08/22/2021    BMI 30.90  kg/m  Body mass index is 30.9 kg/m. Constitutional: Well nourished, well developed female in no acute distress.  Abdomen: diffusely non tender to palpation, non distended, and no masses, hernias Neuro: Grossly intact Psych:  Normal mood and affect.    Assessment:  Problem List Items Addressed This Visit     Anxiety and depression       Relevant Medications   venlafaxine XR (EFFEXOR-XR) 75 MG 24 hr capsule   Surveillance for birth control, oral contraceptives          Plan: Continue Effexor Info given on contraception.  Progesterone only still recommended.     I discussed multiple birth control options and methods with the patient.  The risks and benefits of each were reviewed.  The possible side effects including deep venous thrombosis, breast tenderness, fluid retention, mood changes and abnormal vaginal bleeding were discussed.  Combination as well as progesterone-only options, pros and cons counseled.  Considering Nexplanon vs Depo.  If pills, then not Slynd due to costs.  The effectiveness of 0.18mcg pills is less and this is d/w pt.  She was amenable to this plan and we will see her back for annual/PRN.  A total of 22 minutes were spent face-to-face with the patient as well as preparation, review, communication, and documentation during this encounter.   19m, MD, [facility], [facility] 09/12/2021  4:27 PM

## 2021-09-12 NOTE — Patient Instructions (Signed)
Contraception Choices °Contraception, also called birth control, refers to methods or devices that prevent pregnancy. °Hormonal methods °Contraceptive implant °A contraceptive implant is a thin, plastic tube that contains a hormone that prevents pregnancy. It is different from an intrauterine device (IUD). It is inserted into the upper part of the arm by a health care provider. Implants can be effective for up to 3 years. °Progestin-only injections °Progestin-only injections are injections of progestin, a synthetic form of the hormone progesterone. They are given every 3 months by a health care provider. °Birth control pills °Birth control pills are pills that contain hormones that prevent pregnancy. They must be taken once a day, preferably at the same time each day. A prescription is needed to use this method of contraception. °Birth control patch °The birth control patch contains hormones that prevent pregnancy. It is placed on the skin and must be changed once a week for three weeks and removed on the fourth week. A prescription is needed to use this method of contraception. °Vaginal ring °A vaginal ring contains hormones that prevent pregnancy. It is placed in the vagina for three weeks and removed on the fourth week. After that, the process is repeated with a new ring. A prescription is needed to use this method of contraception. °Emergency contraceptive °Emergency contraceptives prevent pregnancy after unprotected sex. They come in pill form and can be taken up to 5 days after sex. They work best the sooner they are taken after having sex. Most emergency contraceptives are available without a prescription. This method should not be used as your only form of birth control. °Barrier methods °Female condom °A female condom is a thin sheath that is worn over the penis during sex. Condoms keep sperm from going inside a woman's body. They can be used with a sperm-killing substance (spermicide) to increase their  effectiveness. They should be thrown away after one use. °Female condom °A female condom is a soft, loose-fitting sheath that is put into the vagina before sex. The condom keeps sperm from going inside a woman's body. They should be thrown away after one use. °Diaphragm °A diaphragm is a soft, dome-shaped barrier. It is inserted into the vagina before sex, along with a spermicide. The diaphragm blocks sperm from entering the uterus, and the spermicide kills sperm. A diaphragm should be left in the vagina for 6-8 hours after sex and removed within 24 hours. °A diaphragm is prescribed and fitted by a health care provider. A diaphragm should be replaced every 1-2 years, after giving birth, after gaining more than 15 lb (6.8 kg), and after pelvic surgery. °Cervical cap °A cervical cap is a round, soft latex or plastic cup that fits over the cervix. It is inserted into the vagina before sex, along with spermicide. It blocks sperm from entering the uterus. The cap should be left in place for 6-8 hours after sex and removed within 48 hours. A cervical cap must be prescribed and fitted by a health care provider. It should be replaced every 2 years. °Sponge °A sponge is a soft, circular piece of polyurethane foam with spermicide in it. The sponge helps block sperm from entering the uterus, and the spermicide kills sperm. To use it, you make it wet and then insert it into the vagina. It should be inserted before sex, left in for at least 6 hours after sex, and removed and thrown away within 30 hours. °Spermicides °Spermicides are chemicals that kill or block sperm from entering the cervix and uterus.   They can come as a cream, jelly, suppository, foam, or tablet. A spermicide should be inserted into the vagina with an applicator at least 10-15 minutes before sex to allow time for it to work. The process must be repeated every time you have sex. Spermicides do not require a prescription. Intrauterine  contraception Intrauterine device (IUD) An IUD is a T-shaped device that is put in a woman's uterus. There are two types: Hormone IUD.This type contains progestin, a synthetic form of the hormone progesterone. This type can stay in place for 3-5 years. Copper IUD.This type is wrapped in copper wire. It can stay in place for 10 years. Permanent methods of contraception Female tubal ligation In this method, a woman's fallopian tubes are sealed, tied, or blocked during surgery to prevent eggs from traveling to the uterus. Hysteroscopic sterilization In this method, a small, flexible insert is placed into each fallopian tube. The inserts cause scar tissue to form in the fallopian tubes and block them, so sperm cannot reach an egg. The procedure takes about 3 months to be effective. Another form of birth control must be used during those 3 months. Female sterilization This is a procedure to tie off the tubes that carry sperm (vasectomy). After the procedure, the man can still ejaculate fluid (semen). Another form of birth control must be used for 3 months after the procedure. Natural planning methods Natural family planning In this method, a couple does not have sex on days when the woman could become pregnant. Calendar method In this method, the woman keeps track of the length of each menstrual cycle, identifies the days when pregnancy can happen, and does not have sex on those days. Ovulation method In this method, a couple avoids sex during ovulation. Symptothermal method This method involves not having sex during ovulation. The woman typically checks for ovulation by watching changes in her temperature and in the consistency of cervical mucus. Post-ovulation method In this method, a couple waits to have sex until after ovulation. Where to find more information Centers for Disease Control and Prevention: [employer] Summary Contraception, also called birth control, refers to methods or devices  that prevent pregnancy. Hormonal methods of contraception include implants, injections, pills, patches, vaginal rings, and emergency contraceptives. Barrier methods of contraception can include female condoms, female condoms, diaphragms, cervical caps, sponges, and spermicides. There are two types of IUDs (intrauterine devices). An IUD can be put in a woman's uterus to prevent pregnancy for 3-5 years. Permanent sterilization can be done through a procedure for males and females. Natural family planning methods involve nothaving sex on days when the woman could become pregnant. This information is not intended to replace advice given to you by your health care provider. Make sure you discuss any questions you have with your health care provider. Document Revised: 01/01/2020 Document Reviewed: 01/01/2020 Elsevier Patient Education  2022 Elsevier Inc.  Medroxyprogesterone Injection (Contraception) What is this medication? MEDROXYPROGESTERONE (me DROX ee proe JES te rone) prevents ovulation and pregnancy. It belongs to a group of medications called contraceptives. This medication is a progestin hormone. This medicine may be used for other purposes; ask your health care provider or pharmacist if you have questions. COMMON BRAND NAME(S): Depo-Provera, Depo-subQ Provera 104 What should I tell my care team before I take this medication? They need to know if you have any of these conditions: Asthma Blood clots Breast cancer or family history of breast cancer Depression Diabetes Eating disorder (anorexia nervosa) Heart attack High blood pressure HIV infection  or AIDS If you often drink alcohol Kidney disease Liver disease Migraine headaches Osteoporosis, weak bones Seizures Stroke Tobacco smoker Vaginal bleeding An unusual or allergic reaction to medroxyprogesterone, other hormones, medications, foods, dyes, or preservatives Pregnant or trying to get pregnant Breast-feeding How should I use  this medication? Depo-Provera CI contraceptive injection is given into a muscle. Depo-subQ Provera 104 injection is given under the skin. It is given in a hospital or clinic setting. The injection is usually given during the first 5 days after the start of a menstrual period or 6 weeks after delivery of a baby. A patient package insert for the product will be given with each prescription and refill. Be sure to read this information carefully each time. The sheet may change often. Talk to your care team about the use of this medication in children. Special care may be needed. These injections have been used in female children who have started having menstrual periods. Overdosage: If you think you have taken too much of this medicine contact a poison control center or emergency room at once. NOTE: This medicine is only for you. Do not share this medicine with others. What if I miss a dose? Keep appointments for follow-up doses. You must get an injection once every 3 months. It is important not to miss your dose. Call your care team if you are unable to keep an appointment. What may interact with this medication? Antibiotics or medications for infections, especially rifampin and griseofulvin Antivirals for HIV or hepatitis Aprepitant Armodafinil Bexarotene Bosentan Medications for seizures like carbamazepine, felbamate, oxcarbazepine, phenytoin, phenobarbital, primidone, topiramate Mitotane Modafinil St. John's wort This list may not describe all possible interactions. Give your health care provider a list of all the medicines, herbs, non-prescription drugs, or dietary supplements you use. Also tell them if you smoke, drink alcohol, or use illegal drugs. Some items may interact with your medicine. What should I watch for while using this medication? This medication does not protect you against HIV infection (AIDS) or other sexually transmitted diseases. Use of this product may cause you to lose  calcium from your bones. Loss of calcium may cause weak bones (osteoporosis). Only use this product for more than 2 years if other forms of birth control are not right for you. The longer you use this product for birth control the more likely you will be at risk for weak bones. Ask your care team how you can keep strong bones. You may have a change in bleeding pattern or irregular periods. Many females stop having periods while taking this medication. If you have received your injections on time, your chance of being pregnant is very low. If you think you may be pregnant, see your care team as soon as possible. Tell your care team if you want to get pregnant within the next year. The effect of this medication may last a long time after you get your last injection. What side effects may I notice from receiving this medication? Side effects that you should report to your care team as soon as possible: Allergic reactions--skin rash, itching, hives, swelling of the face, lips, tongue, or throat Blood clot--pain, swelling, or warmth in the leg, shortness of breath, chest pain Gallbladder problems--severe stomach pain, nausea, vomiting, fever Increase in blood pressure Liver injury--right upper belly pain, loss of appetite, nausea, light-colored stool, dark yellow or brown urine, yellowing skin or eyes, unusual weakness or fatigue New or worsening migraines or headaches Seizures Stroke--sudden numbness or weakness of the  face, arm, or leg, trouble speaking, confusion, trouble walking, loss of balance or coordination, dizziness, severe headache, change in vision Unusual vaginal discharge, itching, or odor Worsening mood, feelings of depression Side effects that usually do not require medical attention (report to your care team if they continue or are bothersome): Breast pain or tenderness Dark patches of the skin on the face or other sun-exposed areas Irregular menstrual cycles or  spotting Nausea Weight gain This list may not describe all possible side effects. Call your doctor for medical advice about side effects. You may report side effects to FDA at 1-800-FDA-1088. Where should I keep my medication? This injection is only given by a care team. It will not be stored at home. NOTE: This sheet is a summary. It may not cover all possible information. If you have questions about this medicine, talk to your doctor, pharmacist, or health care provider.  2022 Elsevier/Gold Standard (2020-09-29 00:00:00)

## 2021-10-03 ENCOUNTER — Encounter: Payer: Self-pay | Admitting: Obstetrics & Gynecology

## 2021-10-03 ENCOUNTER — Other Ambulatory Visit: Payer: Self-pay | Admitting: Obstetrics & Gynecology

## 2021-10-03 MED ORDER — VENLAFAXINE HCL ER 75 MG PO CP24: 75.0000 mg | ORAL_CAPSULE | Freq: Every day | ORAL | 3 refills | Status: DC

## 2021-10-03 NOTE — Telephone Encounter (Signed)
Rx looks right but I reordered it (90 pills, 3 refills)

## 2021-10-15 ENCOUNTER — Other Ambulatory Visit: Payer: Self-pay | Admitting: Internal Medicine

## 2021-10-15 DIAGNOSIS — F32A Depression, unspecified: Secondary | ICD-10-CM

## 2021-10-27 ENCOUNTER — Encounter: Payer: Self-pay | Admitting: Obstetrics & Gynecology

## 2021-10-28 ENCOUNTER — Other Ambulatory Visit: Payer: Self-pay | Admitting: Obstetrics & Gynecology

## 2021-10-28 MED ORDER — NORETHINDRONE 0.35 MG PO TABS: 1.0000 | ORAL_TABLET | Freq: Every day | ORAL | 3 refills | Status: DC

## 2021-12-23 ENCOUNTER — Telehealth: Payer: BC Managed Care – PPO | Admitting: Physician Assistant

## 2021-12-23 DIAGNOSIS — B9689 Other specified bacterial agents as the cause of diseases classified elsewhere: Secondary | ICD-10-CM | POA: Diagnosis not present

## 2021-12-23 DIAGNOSIS — J019 Acute sinusitis, unspecified: Secondary | ICD-10-CM

## 2021-12-23 MED ORDER — DOXYCYCLINE HYCLATE 100 MG PO TABS: 100.0000 mg | ORAL_TABLET | Freq: Two times a day (BID) | ORAL | 0 refills | Status: DC

## 2021-12-23 MED ORDER — FLUTICASONE PROPIONATE 50 MCG/ACT NA SUSP: 2.0000 | Freq: Every day | NASAL | 0 refills | Status: DC

## 2021-12-23 NOTE — Patient Instructions (Signed)
?Kendra Freeman, thank you for joining [name], PA-C for today's virtual visit.  While this provider is not your primary care provider (PCP), if your PCP is located in our provider database this encounter information will be shared with them immediately following your visit. ? ?Consent: ?(Patient) Kendra Freeman provided verbal consent for this virtual visit at the beginning of the encounter. ? ?Current Medications: ? ?Current Outpatient Medications:  ?  norethindrone (MICRONOR) 0.35 MG tablet, Take 1 tablet (0.35 mg total) by mouth daily., Disp: 90 tablet, Rfl: 3 ?  DULoxetine (CYMBALTA) 30 MG capsule, TAKE 1 CAPSULE(30 MG) BY MOUTH DAILY, Disp: 90 capsule, Rfl: 1 ?  venlafaxine XR (EFFEXOR-XR) 75 MG 24 hr capsule, Take 1 capsule (75 mg total) by mouth daily., Disp: 90 capsule, Rfl: 3  ? ?Medications ordered in this encounter:  ?No orders of the defined types were placed in this encounter. ?  ? ?*If you need refills on other medications prior to your next appointment, please contact your pharmacy* ? ?Follow-Up: ?Call back or seek an in-person evaluation if the symptoms worsen or if the condition fails to improve as anticipated. ? ?Other Instructions ?Please take antibiotic as directed.  Increase fluid intake.  Use Saline nasal spray.  Take a daily multivitamin. Use the Flonase as directed.  Place a humidifier in the bedroom.  Please call or return clinic if symptoms are not improving. ? ?Sinusitis ?Sinusitis is redness, soreness, and swelling (inflammation) of the paranasal sinuses. Paranasal sinuses are air pockets within the bones of your face (beneath the eyes, the middle of the forehead, or above the eyes). In healthy paranasal sinuses, mucus is able to drain out, and air is able to circulate through them by way of your nose. However, when your paranasal sinuses are inflamed, mucus and air can become trapped. This can allow bacteria and other germs to grow and cause infection. ?Sinusitis can  develop quickly and last only a short time (acute) or continue over a long period (chronic). Sinusitis that lasts for more than 12 weeks is considered chronic.  ?CAUSES  ?Causes of sinusitis include: ?Allergies. ?Structural abnormalities, such as displacement of the cartilage that separates your nostrils (deviated septum), which can decrease the air flow through your nose and sinuses and affect sinus drainage. ?Functional abnormalities, such as when the small hairs (cilia) that line your sinuses and help remove mucus do not work properly or are not present. ?SYMPTOMS  ?Symptoms of acute and chronic sinusitis are the same. The primary symptoms are pain and pressure around the affected sinuses. Other symptoms include: ?Upper toothache. ?Earache. ?Headache. ?Bad breath. ?Decreased sense of smell and taste. ?A cough, which worsens when you are lying flat. ?Fatigue. ?Fever. ?Thick drainage from your nose, which often is green and may contain pus (purulent). ?Swelling and warmth over the affected sinuses. ?DIAGNOSIS  ?Your caregiver will perform a physical exam. During the exam, your caregiver may: ?Look in your nose for signs of abnormal growths in your nostrils (nasal polyps). ?Tap over the affected sinus to check for signs of infection. ?View the inside of your sinuses (endoscopy) with a special imaging device with a light attached (endoscope), which is inserted into your sinuses. ?If your caregiver suspects that you have chronic sinusitis, one or more of the following tests may be recommended: ?Allergy tests. ?Nasal culture A sample of mucus is taken from your nose and sent to a lab and screened for bacteria. ?Nasal cytology A sample of mucus is taken  from your nose and examined by your caregiver to determine if your sinusitis is related to an allergy. ?TREATMENT  ?Most cases of acute sinusitis are related to a viral infection and will resolve on their own within 10 days. Sometimes medicines are prescribed to help  relieve symptoms (pain medicine, decongestants, nasal steroid sprays, or saline sprays).  ?However, for sinusitis related to a bacterial infection, your caregiver will prescribe antibiotic medicines. These are medicines that will help kill the bacteria causing the infection.  ?Rarely, sinusitis is caused by a fungal infection. In theses cases, your caregiver will prescribe antifungal medicine. ?For some cases of chronic sinusitis, surgery is needed. Generally, these are cases in which sinusitis recurs more than 3 times per year, despite other treatments. ?HOME CARE INSTRUCTIONS  ?Drink plenty of water. Water helps thin the mucus so your sinuses can drain more easily. ?Use a humidifier. ?Inhale steam 3 to 4 times a day (for example, sit in the bathroom with the shower running). ?Apply a warm, moist washcloth to your face 3 to 4 times a day, or as directed by your caregiver. ?Use saline nasal sprays to help moisten and clean your sinuses. ?Take over-the-counter or prescription medicines for pain, discomfort, or fever only as directed by your caregiver. ?SEEK IMMEDIATE MEDICAL CARE IF: ?You have increasing pain or severe headaches. ?You have nausea, vomiting, or drowsiness. ?You have swelling around your face. ?You have vision problems. ?You have a stiff neck. ?You have difficulty breathing. ?MAKE SURE YOU:  ?Understand these instructions. ?Will watch your condition. ?Will get help right away if you are not doing well or get worse. ?Document Released: 07/27/2005 Document Revised: 10/19/2011 Document Reviewed: 08/11/2011 ?ExitCare? Patient Information ?2014 [place], [place]. ? ? ? ?If you have been instructed to have an in-person evaluation today at a local Urgent Care facility, please use the link below. It will take you to a list of all of our available [facility], including address, phone number and hours of operation. Please do not delay care.  ?[facility] ? ?If you or a family member do not  have a primary care provider, use the link below to schedule a visit and establish care. When you choose a [facility] primary care physician or advanced practice provider, you gain a long-term partner in health. ?Find a Primary Care Provider ? ?Learn more about [facility] in-office and virtual care options: ?[facility] Get Care Now  ?

## 2021-12-23 NOTE — Progress Notes (Signed)
?Virtual Visit Consent  ? ?[facility], you are scheduled for a virtual visit with a [facility] provider today. Just as with appointments in the office, your consent must be obtained to participate. Your consent will be active for this visit and any virtual visit you may have with one of our providers in the next 365 days. If you have a MyChart account, a copy of this consent can be sent to you electronically. ? ?As this is a virtual visit, video technology does not allow for your provider to perform a traditional examination. This may limit your provider's ability to fully assess your condition. If your provider identifies any concerns that need to be evaluated in person or the need to arrange testing (such as labs, EKG, etc.), we will make arrangements to do so. Although advances in technology are sophisticated, we cannot ensure that it will always work on either your end or our end. If the connection with a video visit is poor, the visit may have to be switched to a telephone visit. With either a video or telephone visit, we are not always able to ensure that we have a secure connection. ? ?By engaging in this virtual visit, you consent to the provision of healthcare and authorize for your insurance to be billed (if applicable) for the services provided during this visit. Depending on your insurance coverage, you may receive a charge related to this service. ? ?I need to obtain your verbal consent now. Are you willing to proceed with your visit today? Kendra Freeman has provided verbal consent on 12/23/2021 for a virtual visit (video or telephone). Kendra Climes, PA-C ? ?Date: 12/23/2021 10:52 AM ? ?Virtual Visit via Video Note  ? ?IPiedad Freeman, connected with  Kendra Freeman  ([id number], July 26, 1986) on 12/23/21 at 10:45 AM EDT by a video-enabled telemedicine application and verified that I am speaking with the correct person using two identifiers. ? ?Location: ?Patient: Virtual Visit Location  Patient: Home ?Provider: Virtual Visit Location Provider: Home Office ?  ?I discussed the limitations of evaluation and management by telemedicine and the availability of in person appointments. The patient expressed understanding and agreed to proceed.   ? ?History of Present Illness: ?Kendra Freeman is a 36 y.o. who identifies as a female who was assigned female at birth, and is being seen today for URI symptoms starting about a week or so ago and has been continually worsening since that time. Notes nasal congestion, sinus pressure and sinus pain with thick nasal discharge that is colored. Denies fever, chills or aches. Also noted significant ear pressure and popping bilaterally. Cough is still mild. Denies recent travel or sick contact. Has taken several OTC medications for symptoms including Benadryl, Alka Seltzer Cold and Sinus, Mucinex-DM.  ? ?HPI: HPI  ?Problems:  ?Patient Active Problem List  ? Diagnosis Date Noted  ? Depressive disorder 03/11/2018  ? Anxiety 03/11/2018  ? Vulvar lesion 01/20/2018  ? Skin tag 01/20/2018  ? Vitamin D deficiency 01/06/2018  ? Skin tag of female perineum 01/06/2018  ? Fatigue 01/06/2018  ?  ?Allergies:  ?Allergies  ?Allergen Reactions  ? Amoxicillin Hives  ? Zithromax [Azithromycin Dihydrate] Hives  ? Azithromycin Rash  ? ?Medications:  ?Current Outpatient Medications:  ?  doxycycline (VIBRA-TABS) 100 MG tablet, Take 1 tablet (100 mg total) by mouth 2 (two) times daily., Disp: 20 tablet, Rfl: 0 ?  fluticasone (FLONASE) 50 MCG/ACT nasal spray, Place 2 sprays into both nostrils daily.,  Disp: 16 g, Rfl: 0 ?  norethindrone (MICRONOR) 0.35 MG tablet, Take 1 tablet (0.35 mg total) by mouth daily., Disp: 90 tablet, Rfl: 3 ?  venlafaxine XR (EFFEXOR-XR) 75 MG 24 hr capsule, Take 1 capsule (75 mg total) by mouth daily., Disp: 90 capsule, Rfl: 3 ? ?Observations/Objective: ?Patient is well-developed, well-nourished in no acute distress.  ?Resting comfortably at home.  ?Head is  normocephalic, atraumatic.  ?No labored breathing. ?Speech is clear and coherent with logical content.  ?Patient is alert and oriented at baseline.  ? ?Assessment and Plan: ?1. Acute bacterial sinusitis ?- fluticasone (FLONASE) 50 MCG/ACT nasal spray; Place 2 sprays into both nostrils daily.  Dispense: 16 g; Refill: 0 ?- doxycycline (VIBRA-TABS) 100 MG tablet; Take 1 tablet (100 mg total) by mouth 2 (two) times daily.  Dispense: 20 tablet; Refill: 0 ? ?Rx Doxycycline.  Increase fluids.  Rest.  Saline nasal spray.  Probiotic.  Mucinex as directed.  Humidifier in bedroom. Flonase per orders.  Call or return to clinic if symptoms are not improving. ? ? ?Follow Up Instructions: ?I discussed the assessment and treatment plan with the patient. The patient was provided an opportunity to ask questions and all were answered. The patient agreed with the plan and demonstrated an understanding of the instructions.  A copy of instructions were sent to the patient via MyChart unless otherwise noted below.  ? ?The patient was advised to call back or seek an in-person evaluation if the symptoms worsen or if the condition fails to improve as anticipated. ? ?Time:  ?I spent 10 minutes with the patient via telehealth technology discussing the above problems/concerns.   ? ?Kendra Climes, PA-C ?

## 2022-02-24 DIAGNOSIS — H5213 Myopia, bilateral: Secondary | ICD-10-CM | POA: Diagnosis not present

## 2022-09-28 ENCOUNTER — Other Ambulatory Visit: Payer: Self-pay

## 2022-09-28 MED ORDER — VENLAFAXINE HCL ER 75 MG PO CP24: 75.0000 mg | ORAL_CAPSULE | Freq: Every day | ORAL | 3 refills | Status: DC

## 2022-09-28 NOTE — Telephone Encounter (Signed)
Patient contacted office requesting refill on Effexor-XR, patient has a follow up scheduled with Dr. Hulan Fray for 10/05/26. KW

## 2022-10-05 ENCOUNTER — Ambulatory Visit (INDEPENDENT_AMBULATORY_CARE_PROVIDER_SITE_OTHER): Payer: BC Managed Care – PPO | Admitting: Obstetrics & Gynecology

## 2022-10-05 ENCOUNTER — Encounter: Payer: Self-pay | Admitting: Obstetrics & Gynecology

## 2022-10-05 VITALS — BP 141/98 | Ht 64.0 in | Wt 188.0 lb

## 2022-10-05 DIAGNOSIS — Z0000 Encounter for general adult medical examination without abnormal findings: Secondary | ICD-10-CM | POA: Diagnosis not present

## 2022-10-05 DIAGNOSIS — Z01419 Encounter for gynecological examination (general) (routine) without abnormal findings: Secondary | ICD-10-CM

## 2022-10-05 MED ORDER — NORETHINDRONE 0.35 MG PO TABS: 1.0000 | ORAL_TABLET | Freq: Every day | ORAL | 5 refills | Status: DC

## 2022-10-05 MED ORDER — VENLAFAXINE HCL ER 75 MG PO CP24: 75.0000 mg | ORAL_CAPSULE | Freq: Every day | ORAL | 5 refills | Status: DC

## 2022-10-05 NOTE — Progress Notes (Signed)
Subjective:    Kendra Freeman is a 37 y.o. married P1 (37 yo) who presents for an annual exam. The patient has no complaints today. The patient is sexually active. GYN screening history: last pap: was normal in 2022. The patient wears seatbelts: yes. The patient participates in regular exercise: no. Has the patient ever been transfused or tattooed?: no. The patient reports that there is not domestic violence in her life.   Menstrual History: OB History     Gravida  1   Para  1   Term  1   Preterm      AB      Living  1      SAB      IAB      Ectopic      Multiple      Live Births              Menarche age: 69 Patient's last menstrual period was 09/29/2022. Period Cycle (Days): 28 Period Duration (Days): 3-4 Period Pattern: Regular Menstrual Flow: Light Dysmenorrhea: None  The following portions of the patient's history were reviewed and updated as appropriate: allergies, current medications, past family history, past medical history, past social history, past surgical history, and problem list.  Review of Systems Pertinent items are noted in HPI.  No FH of breast, gyn, colon cancers  Objective:    BP (!) 141/98   Ht '5\' 4"'$  (1.626 m)   Wt 188 lb (85.3 kg)   LMP 09/29/2022   BMI 32.27 kg/m   General Appearance:    Alert, cooperative, no distress, appears stated age  Head:    Normocephalic, without obvious abnormality, atraumatic  Eyes:    PERRL, conjunctiva/corneas clear, EOM's intact, fundi    benign, both eyes  Ears:    Normal TM's and external ear canals, both ears  Nose:   Nares normal, septum midline, mucosa normal, no drainage    or sinus tenderness  Throat:   Lips, mucosa, and tongue normal; teeth and gums normal  Neck:   Supple, symmetrical, trachea midline, no adenopathy;    thyroid:  no enlargement/tenderness/nodules; no carotid   bruit or JVD  Back:     Symmetric, no curvature, ROM normal, no CVA tenderness  Lungs:     Clear to auscultation  bilaterally, respirations unlabored  Chest Wall:    No tenderness or deformity   Heart:    Regular rate and rhythm, S1 and S2 normal, no murmur, rub   or gallop  Breast Exam:    No tenderness, masses, or nipple abnormality  Abdomen:     Soft, non-tender, bowel sounds active all four quadrants,    no masses, no organomegaly  Genitalia:    Normal female without lesion, discharge or tenderness     Extremities:   Extremities normal, atraumatic, no cyanosis or edema  Pulses:   2+ and symmetric all extremities  Skin:   Skin color, texture, turgor normal, no rashes or lesions  Lymph nodes:   Cervical, supraclavicular, and axillary nodes normal  Neurologic:   CNII-XII intact, normal strength, sensation and reflexes    throughout  .    Assessment:    Healthy female exam.    Plan:     Pap smear due in 2027 Preventative labs drawn today Rec followup with primary care for abnormal labs  Micronor and effexor refilled

## 2022-10-06 ENCOUNTER — Encounter: Payer: Self-pay | Admitting: Obstetrics & Gynecology

## 2022-10-06 LAB — COMPREHENSIVE METABOLIC PANEL
ALT: 12 IU/L (ref 0–32)
AST: 16 IU/L (ref 0–40)
Albumin/Globulin Ratio: 2.2 (ref 1.2–2.2)
Albumin: 4.4 g/dL (ref 3.9–4.9)
Alkaline Phosphatase: 77 IU/L (ref 44–121)
BUN/Creatinine Ratio: 12 (ref 9–23)
BUN: 11 mg/dL (ref 6–20)
Bilirubin Total: <0.2 mg/dL (ref 0.0–1.2)
CO2: 20 mmol/L (ref 20–29)
Calcium: 9.2 mg/dL (ref 8.7–10.2)
Chloride: 105 mmol/L (ref 96–106)
Creatinine, Ser: 0.9 mg/dL (ref 0.57–1.00)
Globulin, Total: 2 g/dL (ref 1.5–4.5)
Glucose: 85 mg/dL (ref 70–99)
Potassium: 4.1 mmol/L (ref 3.5–5.2)
Sodium: 141 mmol/L (ref 134–144)
Total Protein: 6.4 g/dL (ref 6.0–8.5)
eGFR: 83 mL/min/{1.73_m2} (ref >59)

## 2022-10-06 LAB — TSH+FREE T4
Free T4: 1 ng/dL (ref 0.82–1.77)
TSH: 1.85 u[IU]/mL (ref 0.450–4.500)

## 2022-10-06 LAB — CBC
Hematocrit: 40.7 % (ref 34.0–46.6)
Hemoglobin: 13.2 g/dL (ref 11.1–15.9)
MCH: 29.9 pg (ref 26.6–33.0)
MCHC: 32.4 g/dL (ref 31.5–35.7)
MCV: 92 fL (ref 79–97)
Platelets: 325 10*3/uL (ref 150–450)
RBC: 4.42 x10E6/uL (ref 3.77–5.28)
RDW: 12.3 % (ref 11.7–15.4)
WBC: 7.2 10*3/uL (ref 3.4–10.8)

## 2022-10-06 LAB — VITAMIN D 25 HYDROXY (VIT D DEFICIENCY, FRACTURES): Vit D, 25-Hydroxy: 47.3 ng/mL (ref 30.0–100.0)

## 2022-10-06 LAB — LIPID PANEL
Chol/HDL Ratio: 4 ratio (ref 0.0–4.4)
Cholesterol, Total: 174 mg/dL (ref 100–199)
HDL: 44 mg/dL (ref >39)
LDL Chol Calc (NIH): 115 mg/dL — ABNORMAL HIGH (ref 0–99)
Triglycerides: 77 mg/dL (ref 0–149)
VLDL Cholesterol Cal: 15 mg/dL (ref 5–40)

## 2022-10-06 LAB — HEMOGLOBIN A1C
Est. average glucose Bld gHb Est-mCnc: 105 mg/dL
Hgb A1c MFr Bld: 5.3 % (ref 4.8–5.6)

## 2022-10-28 DIAGNOSIS — F331 Major depressive disorder, recurrent, moderate: Secondary | ICD-10-CM | POA: Diagnosis not present

## 2022-10-28 DIAGNOSIS — R41840 Attention and concentration deficit: Secondary | ICD-10-CM | POA: Diagnosis not present

## 2023-10-07 ENCOUNTER — Other Ambulatory Visit: Payer: Self-pay

## 2023-10-07 DIAGNOSIS — Z3041 Encounter for surveillance of contraceptive pills: Secondary | ICD-10-CM

## 2023-10-07 MED ORDER — NORETHINDRONE 0.35 MG PO TABS: 1.0000 | ORAL_TABLET | Freq: Every day | ORAL | 0 refills | Status: DC

## 2023-10-15 ENCOUNTER — Ambulatory Visit (INDEPENDENT_AMBULATORY_CARE_PROVIDER_SITE_OTHER): Payer: Self-pay | Admitting: Certified Nurse Midwife

## 2023-10-15 ENCOUNTER — Encounter: Payer: Self-pay | Admitting: Certified Nurse Midwife

## 2023-10-15 ENCOUNTER — Other Ambulatory Visit: Payer: Self-pay | Admitting: Certified Nurse Midwife

## 2023-10-15 VITALS — BP 125/77 | HR 83 | Ht 64.0 in | Wt 176.0 lb

## 2023-10-15 DIAGNOSIS — Z01419 Encounter for gynecological examination (general) (routine) without abnormal findings: Secondary | ICD-10-CM | POA: Diagnosis not present

## 2023-10-15 DIAGNOSIS — Z0000 Encounter for general adult medical examination without abnormal findings: Secondary | ICD-10-CM | POA: Insufficient documentation

## 2023-10-15 DIAGNOSIS — Z3041 Encounter for surveillance of contraceptive pills: Secondary | ICD-10-CM

## 2023-10-15 DIAGNOSIS — Z309 Encounter for contraceptive management, unspecified: Secondary | ICD-10-CM | POA: Insufficient documentation

## 2023-10-15 DIAGNOSIS — Z1231 Encounter for screening mammogram for malignant neoplasm of breast: Secondary | ICD-10-CM

## 2023-10-15 MED ORDER — NORETHINDRONE 0.35 MG PO TABS: 1.0000 | ORAL_TABLET | Freq: Every day | ORAL | 4 refills | Status: AC

## 2023-10-15 NOTE — Progress Notes (Signed)
 Outpatient Gynecology Note: Annual Visit  Assessment/Plan:    Kendra Freeman is a 38 y.o. female G22P1001 with normal well-woman gynecologic exam.   Well woman exam (no gynecological exam) - Reviewed health maintenance topics as documented below. - Most recent pap smear in 2022, repeat cervical cancer screening due 2027 . -  Mammogram UTD, ordered screening today.  - Satisfied with current contraception.  - STI screening declined.    Contraception management Reviewed risk factors for contraception including patient age and smoking status. Will continue to recommend progesterone only options. Reviewed all options and patient desires to remain in COCs. Had a ectopic pregnancy with IUD and feels strongly she does not ever want to have another IUD.      Risk factors identified in Subjective to review:  Orders Placed This Encounter  Procedures   MM DIGITAL SCREENING BILATERAL    Standing Status:   Future    Expiration Date:   10/14/2024    Reason for exam::   Screening    Is the patient pregnant?:   No    [facility]   Hemoglobin A1c   CBC with Differential/Platelet   TSH   VITAMIN D 25 Hydroxy (Vit-D Deficiency, Fractures)   Thyroid Panel With TSH   Comprehensive metabolic panel   Current Outpatient Medications  Medication Instructions   clonazePAM (KLONOPIN) 0.5 mg, Daily   methylphenidate (CONCERTA) 36 mg, Every morning   norethindrone (MICRONOR) 0.35 mg, Oral, Daily   sertraline (ZOLOFT) 50 mg, Daily    No follow-ups on file.    Subjective:    Kendra Freeman is a 38 y.o. female G1P1001 who presents for annual wellness visit.   Occupation Air[employer] pilot.    Lives with husband and children    CONCERNS? Irregular periods on COCs. Having them randomly throughout the year, always light and lasting a few days.   Well Woman Visit:  GYN HISTORY:  Patient's last menstrual period was 09/26/2023 (approximate).     Menstrual History: OB  History     Gravida  1   Para  1   Term  1   Preterm      AB      Living  1      SAB      IAB      Ectopic      Multiple      Live Births              Menarche age:  Patient's last menstrual period was 09/26/2023 (approximate). Period Duration (Days): 1-2 Period Pattern: (!) Irregular Menstrual Flow: Light Dysmenorrhea: None  Urinary incontinence? Yes - declines PT today. Advised Youtube videos made by physical therapist for home treatment  Sexually active: Y Number of sexual partners: 1 Gender of sexual Partners: male Dyspareunia? No Last pap:was normal  History of abnormal Pap: No STI/HIV testing or immunizations needed? No.  Contraceptive methods: oral progesterone-only contraceptive  Health Maintenance > Reviewed breast self-awareness > History of abnormal mammogram: No > Body mass index is 30.21 kg/m.  > Safe in current relationship? Yes.   > Concern for alcohol abuse? No   Tobacco or other drug use: Reviewed and recommending establishing with PCP to have regular cancer screenings for smokers.  Tobacco Use: High Risk (10/15/2023)   Patient History    Smoking Tobacco Use: Every Day    Smokeless Tobacco Use: Never    Passive Exposure: Not on file  _________________________________________________________  Current Outpatient Medications  Medication Sig Dispense Refill   clonazePAM (KLONOPIN) 0.5 MG tablet Take 0.5 mg by mouth daily.     methylphenidate 36 MG PO CR tablet Take 36 mg by mouth every morning.     norethindrone (MICRONOR) 0.35 MG tablet Take 1 tablet (0.35 mg total) by mouth daily. 30 tablet 0   sertraline (ZOLOFT) 50 MG tablet Take 50 mg by mouth daily.     No current facility-administered medications for this visit.   Allergies  Allergen Reactions   Amoxicillin Hives   Zithromax [Azithromycin Dihydrate] Hives   Azithromycin Rash    Past Medical History:  Diagnosis Date   ADD (attention deficit disorder)     Attention deficit disorder    History of ectopic pregnancy 07/2019   Low vitamin D level 2018   Major depression    PMS (premenstrual syndrome)    Pseudotumor cerebri    Past Surgical History:  Procedure Laterality Date   ADENOIDECTOMY     CESAREAN SECTION     INNER EAR SURGERY     INTRAUTERINE DEVICE (IUD) INSERTION  08/2018   TONSILLECTOMY     OB History     Gravida  1   Para  1   Term  1   Preterm      AB      Living  1      SAB      IAB      Ectopic      Multiple      Live Births             Social History   Tobacco Use   Smoking status: Every Day    Types: Cigarettes   Smokeless tobacco: Never  Substance Use Topics   Alcohol use: No   Social History   Substance and Sexual Activity  Sexual Activity Yes   Birth control/protection: Pill     There is no immunization history on file for this patient.   Review Of Systems  Constitutional: Denied constitutional symptoms, night sweats, recent illness, fatigue, fever, insomnia and weight loss.  Eyes: Denied eye symptoms, eye pain, photophobia, vision change and visual disturbance.  Ears/Nose/Throat/Neck: Denied ear, nose, throat or neck symptoms, hearing loss, nasal discharge, sinus congestion and sore throat.  Cardiovascular: Denied cardiovascular symptoms, arrhythmia, chest pain/pressure, edema, exercise intolerance, orthopnea and palpitations.  Respiratory: Denied pulmonary symptoms, asthma, pleuritic pain, productive sputum, cough, dyspnea and wheezing.  Gastrointestinal: Denied, gastro-esophageal reflux, melena, nausea and vomiting.  Genitourinary: Denied genitourinary symptoms including symptomatic vaginal discharge, pelvic relaxation issues, and urinary complaints.  Musculoskeletal: Denied musculoskeletal symptoms, stiffness, swelling, muscle weakness and myalgia.  Dermatologic: Denied dermatology symptoms, rash and scar.  Neurologic: Denied neurology symptoms, dizziness, headache, neck  pain and syncope.  Psychiatric: Denied psychiatric symptoms, anxiety and depression.  Endocrine: Denied endocrine symptoms including hot flashes and night sweats.      Objective:    BP 125/77   Pulse 83   Ht 5\' 4"  (1.626 m)   Wt 176 lb (79.8 kg)   LMP 09/26/2023 (Approximate)   BMI 30.21 kg/m   Constitutional: Well-developed, well-nourished female in no acute distress Neurological: Alert and oriented to person, place, and time Psychiatric: Mood and affect appropriate Skin: No rashes or lesions Neck: Supple without masses. Trachea is midline.Thyroid is normal size without masses Lymphatics: No cervical, axillary, supraclavicular, or inguinal adenopathy noted Respiratory: Clear to auscultation bilaterally. Good air movement with normal work of breathing. Cardiovascular: Regular  rate and rhythm. Extremities grossly normal, nontender with no edema; pulses regular Gastrointestinal: Soft, nontender, nondistended. No masses or hernias appreciated. No hepatosplenomegaly. No fluid wave. No rebound or guarding. Breast Exam: normal appearance, no masses or tenderness Genitourinary: declined Adnexae: Non-palpable and non-tender Perineum/Anus: declined Rectal: deferred    Oley Balm, CNM 10/15/23 11:44 AM

## 2023-10-15 NOTE — Assessment & Plan Note (Signed)
 Reviewed risk factors for contraception including patient age and smoking status. Will continue to recommend progesterone only options. Reviewed all options and patient desires to remain in COCs. Had a ectopic pregnancy with IUD and feels strongly she does not ever want to have another IUD.

## 2023-10-15 NOTE — Assessment & Plan Note (Signed)
-   Reviewed health maintenance topics as documented below. - Most recent pap smear in 2022, repeat cervical cancer screening due 2027 . -  Mammogram UTD, ordered screening today.  - Satisfied with current contraception.  - STI screening declined.

## 2023-10-16 LAB — CBC WITH DIFFERENTIAL/PLATELET
Basophils Absolute: 0.1 10*3/uL (ref 0.0–0.2)
Basos: 1 %
EOS (ABSOLUTE): 0.1 10*3/uL (ref 0.0–0.4)
Eos: 1 %
Hematocrit: 41.3 % (ref 34.0–46.6)
Hemoglobin: 13.5 g/dL (ref 11.1–15.9)
Immature Grans (Abs): 0 10*3/uL (ref 0.0–0.1)
Immature Granulocytes: 1 %
Lymphocytes Absolute: 2.6 10*3/uL (ref 0.7–3.1)
Lymphs: 30 %
MCH: 30.5 pg (ref 26.6–33.0)
MCHC: 32.7 g/dL (ref 31.5–35.7)
MCV: 93 fL (ref 79–97)
Monocytes Absolute: 0.6 10*3/uL (ref 0.1–0.9)
Monocytes: 6 %
Neutrophils Absolute: 5.4 10*3/uL (ref 1.4–7.0)
Neutrophils: 61 %
Platelets: 352 10*3/uL (ref 150–450)
RBC: 4.42 x10E6/uL (ref 3.77–5.28)
RDW: 12.1 % (ref 11.7–15.4)
WBC: 8.8 10*3/uL (ref 3.4–10.8)

## 2023-10-16 LAB — COMPREHENSIVE METABOLIC PANEL
ALT: 9 IU/L (ref 0–32)
AST: 12 IU/L (ref 0–40)
Albumin: 4.3 g/dL (ref 3.9–4.9)
Alkaline Phosphatase: 80 IU/L (ref 44–121)
BUN/Creatinine Ratio: 14 (ref 9–23)
BUN: 11 mg/dL (ref 6–24)
Bilirubin Total: <0.2 mg/dL (ref 0.0–1.2)
CO2: 21 mmol/L (ref 20–29)
Calcium: 9.1 mg/dL (ref 8.7–10.2)
Chloride: 104 mmol/L (ref 96–106)
Creatinine, Ser: 0.81 mg/dL (ref 0.57–1.00)
Globulin, Total: 2.1 g/dL (ref 1.5–4.5)
Glucose: 78 mg/dL (ref 70–99)
Potassium: 4.6 mmol/L (ref 3.5–5.2)
Sodium: 137 mmol/L (ref 134–144)
Total Protein: 6.4 g/dL (ref 6.0–8.5)
eGFR: 94 mL/min/{1.73_m2} (ref >59)

## 2023-10-16 LAB — THYROID PANEL WITH TSH
Free Thyroxine Index: 1.8 (ref 1.2–4.9)
T3 Uptake Ratio: 26 % (ref 24–39)
T4, Total: 7.1 ug/dL (ref 4.5–12.0)
TSH: 1.24 u[IU]/mL (ref 0.450–4.500)

## 2023-10-16 LAB — HEMOGLOBIN A1C
Est. average glucose Bld gHb Est-mCnc: 103 mg/dL
Hgb A1c MFr Bld: 5.2 % (ref 4.8–5.6)

## 2023-10-16 LAB — VITAMIN D 25 HYDROXY (VIT D DEFICIENCY, FRACTURES): Vit D, 25-Hydroxy: 65.3 ng/mL (ref 30.0–100.0)

## 2023-11-15 DIAGNOSIS — F900 Attention-deficit hyperactivity disorder, predominantly inattentive type: Secondary | ICD-10-CM | POA: Diagnosis not present

## 2023-11-15 DIAGNOSIS — F411 Generalized anxiety disorder: Secondary | ICD-10-CM | POA: Diagnosis not present
